# Patient Record
Sex: Female | Born: 1975
Health system: Southern US, Community
[De-identification: ages and names within clinical notes are randomized; demographics above are authoritative.]

## PROBLEM LIST (undated history)

## (undated) DIAGNOSIS — K219 Gastro-esophageal reflux disease without esophagitis: Secondary | ICD-10-CM

## (undated) HISTORY — PX: NO PAST SURGERIES: SHX2092

---

## 2009-01-03 ENCOUNTER — Inpatient Hospital Stay (HOSPITAL_COMMUNITY): Admission: AD | Admit: 2009-01-03 | Discharge: 2009-01-03 | Payer: Self-pay | Admitting: Family Medicine

## 2009-01-03 ENCOUNTER — Emergency Department (HOSPITAL_COMMUNITY): Admission: EM | Admit: 2009-01-03 | Discharge: 2009-01-03 | Payer: Self-pay | Admitting: Family Medicine

## 2009-01-04 ENCOUNTER — Ambulatory Visit: Payer: Self-pay | Admitting: Family Medicine

## 2009-01-04 ENCOUNTER — Inpatient Hospital Stay (HOSPITAL_COMMUNITY): Admission: AD | Admit: 2009-01-04 | Discharge: 2009-01-04 | Payer: Self-pay | Admitting: Family Medicine

## 2009-02-02 ENCOUNTER — Encounter: Admission: RE | Admit: 2009-02-02 | Discharge: 2009-02-02 | Payer: Self-pay | Admitting: Pulmonary Disease

## 2009-04-18 ENCOUNTER — Ambulatory Visit (HOSPITAL_COMMUNITY): Admission: RE | Admit: 2009-04-18 | Discharge: 2009-04-18 | Payer: Self-pay | Admitting: Obstetrics & Gynecology

## 2009-04-29 ENCOUNTER — Ambulatory Visit: Payer: Self-pay | Admitting: Obstetrics & Gynecology

## 2009-04-29 ENCOUNTER — Inpatient Hospital Stay (HOSPITAL_COMMUNITY): Admission: AD | Admit: 2009-04-29 | Discharge: 2009-05-01 | Payer: Self-pay | Admitting: Obstetrics & Gynecology

## 2009-08-28 IMAGING — US US OB FOLLOW-UP
1 series · 14 of 22 positions shown · non-contrast
Comparison: none

OBSTETRICAL ULTRASOUND:
 This ultrasound exam was performed in the [HOSPITAL] Ultrasound Department.  The OB US report was generated in the AS system, and faxed to the ordering physician.  This report is also available in [REDACTED] PACS.

[Series 1: us ob follow up · 0.33mm/px · 14 of 22 slices shown]
[im 1/22]
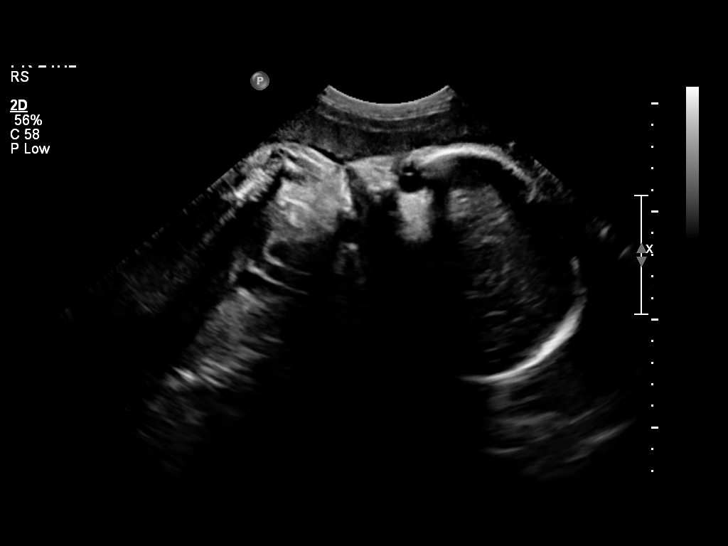
[im 3/22]
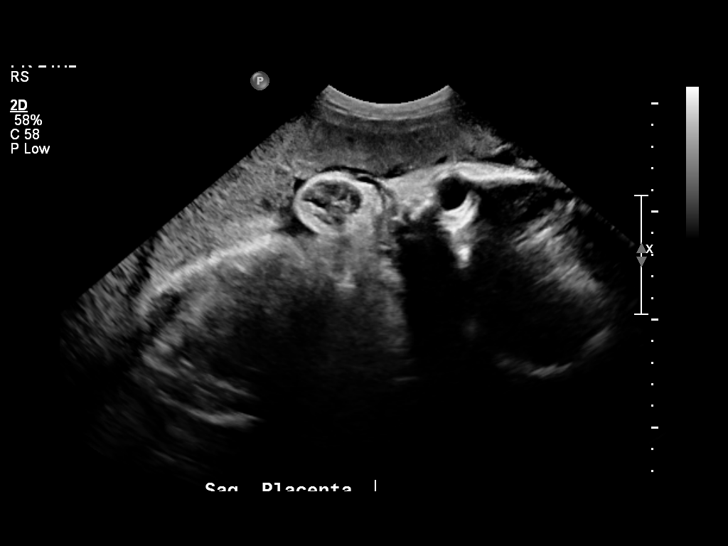
[im 4/22]
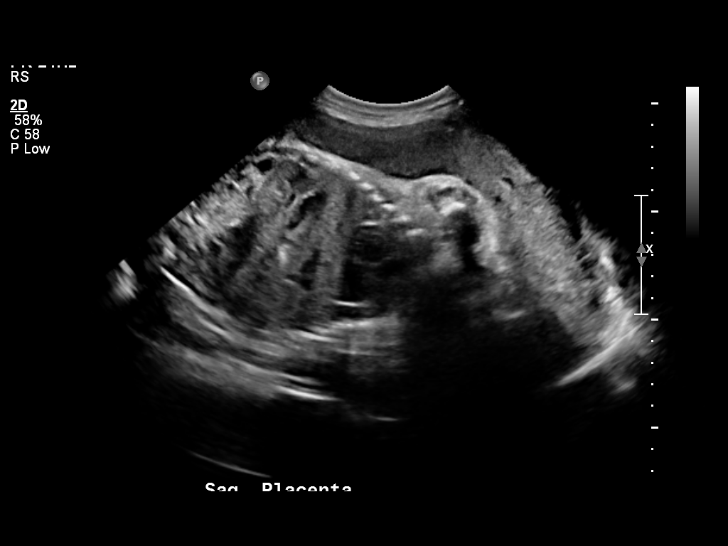
[im 6/22]
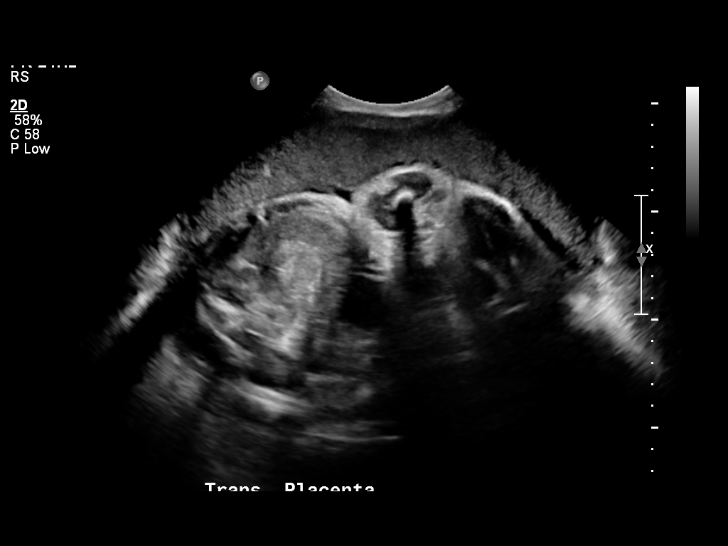
[im 8/22]
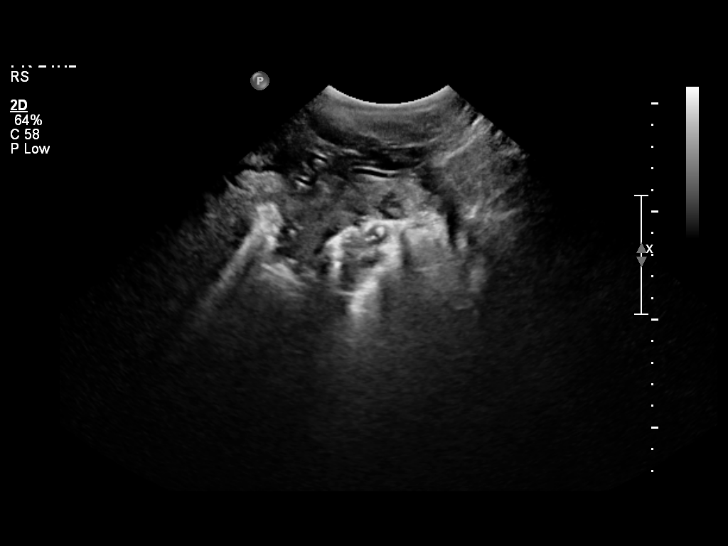
[im 9/22]
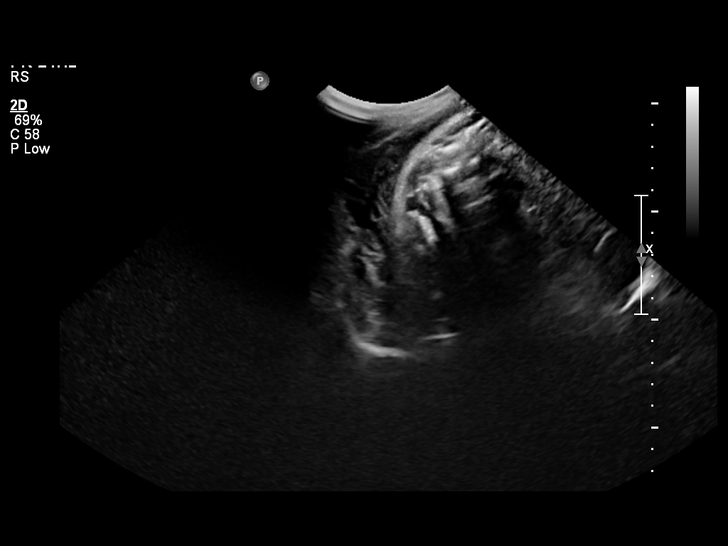
[im 11/22]
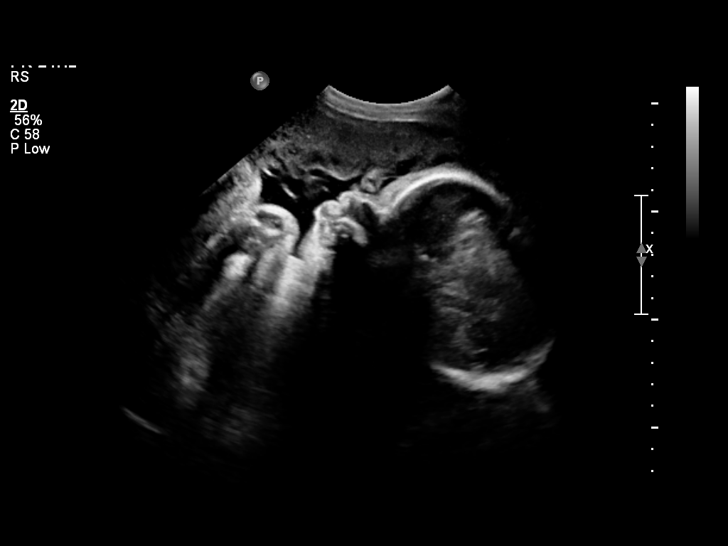
[im 12/22]
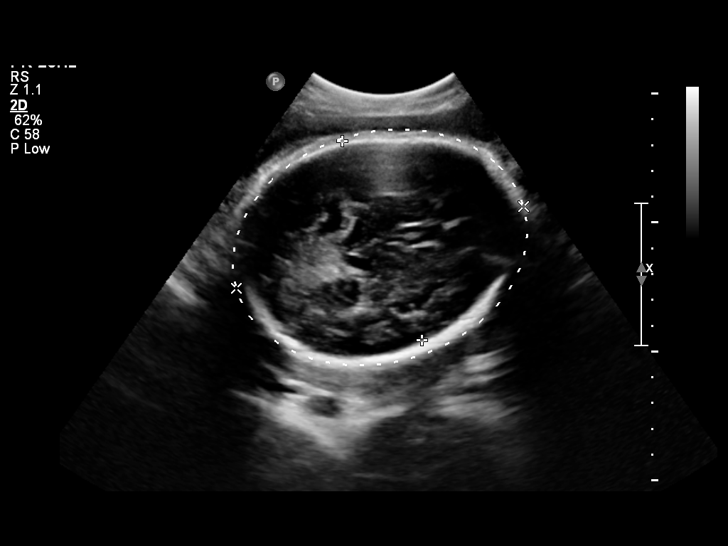
[im 14/22]
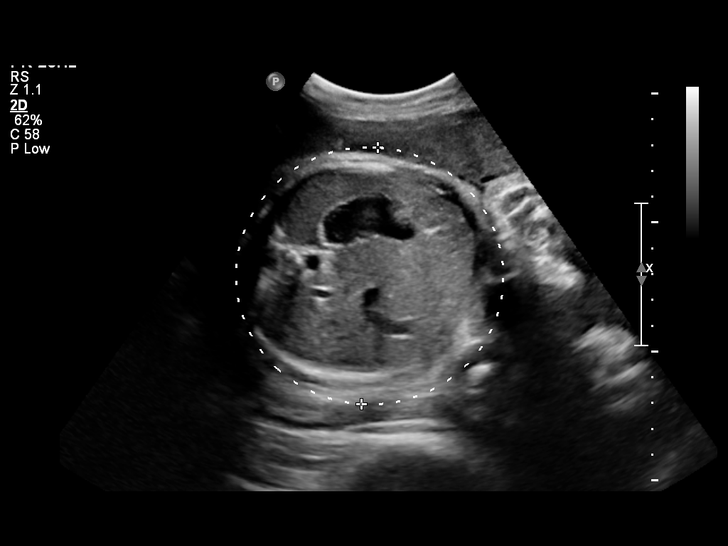
[im 15/22]
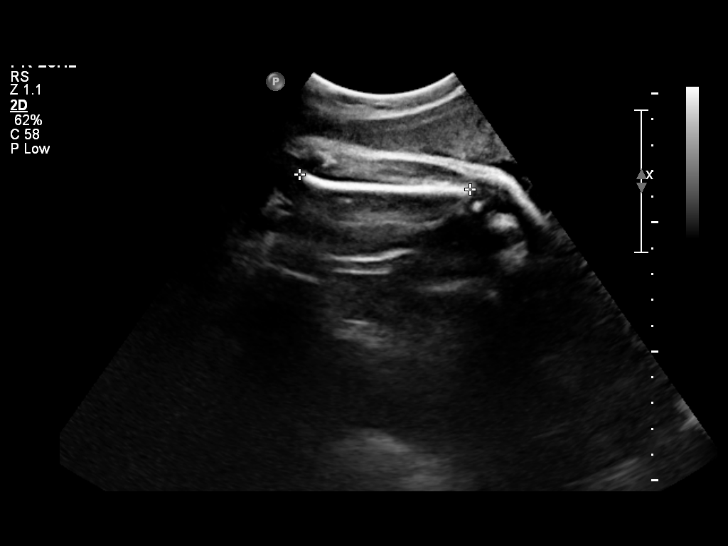
[im 17/22]
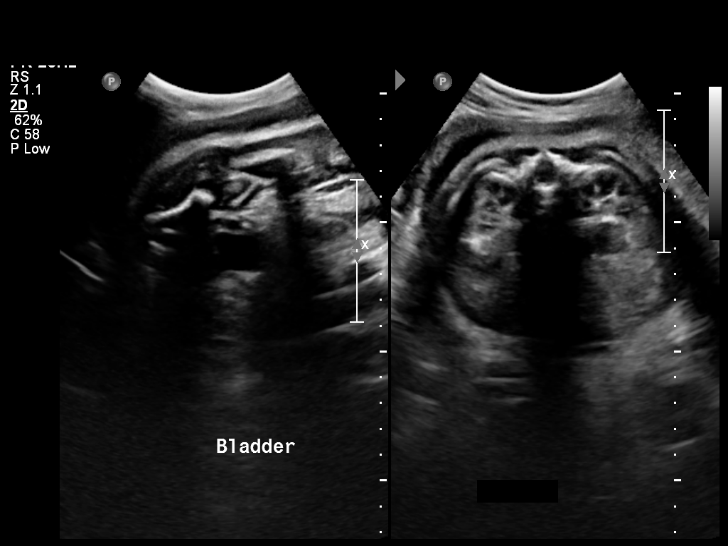
[im 19/22]
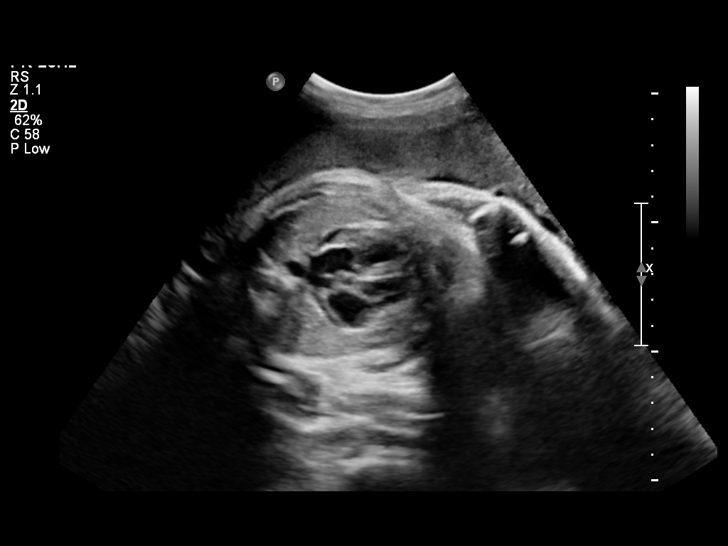
[im 20/22]
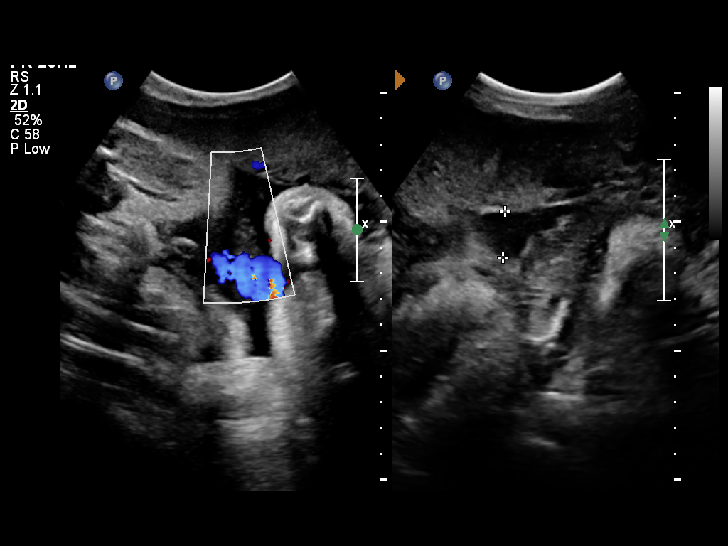
[im 22/22]
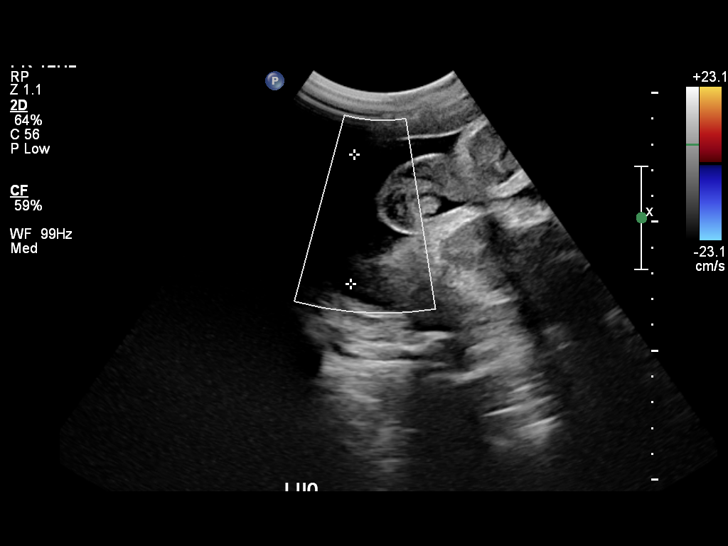

[14 of 22 positions shown; findings below may reference images not displayed]

IMPRESSION: See AS Obstetric US report.

## 2010-12-23 LAB — CBC
HCT: 36.3 % (ref 36.0–46.0)
MCV: 84.2 fL (ref 78.0–100.0)
Platelets: 176 10*3/uL (ref 150–400)
RBC: 4.07 MIL/uL (ref 3.87–5.11)
WBC: 11.6 10*3/uL — ABNORMAL HIGH (ref 4.0–10.5)
WBC: 12.8 10*3/uL — ABNORMAL HIGH (ref 4.0–10.5)

## 2010-12-23 LAB — RAPID HIV SCREEN (WH-MAU): Rapid HIV Screen: NONREACTIVE

## 2010-12-23 LAB — RPR: RPR Ser Ql: NONREACTIVE

## 2010-12-27 LAB — GC/CHLAMYDIA PROBE AMP, GENITAL
Chlamydia, DNA Probe: POSITIVE — AB
GC Probe Amp, Genital: NEGATIVE

## 2010-12-27 LAB — URINE CULTURE
Colony Count: >=100000
Colony Count: >=100000

## 2010-12-27 LAB — POCT URINALYSIS DIP (DEVICE)
Bilirubin Urine: NEGATIVE
Glucose, UA: NEGATIVE mg/dL
Ketones, ur: NEGATIVE mg/dL
Specific Gravity, Urine: 1.02 (ref 1.005–1.030)

## 2010-12-27 LAB — DIFFERENTIAL
Basophils Absolute: 0 10*3/uL (ref 0.0–0.1)
Basophils Relative: 0 % (ref 0–1)
Eosinophils Absolute: 0.1 10*3/uL (ref 0.0–0.7)
Neutro Abs: 11.1 10*3/uL — ABNORMAL HIGH (ref 1.7–7.7)
Neutrophils Relative %: 91 % — ABNORMAL HIGH (ref 43–77)

## 2010-12-27 LAB — WET PREP, GENITAL
Trich, Wet Prep: NONE SEEN
Yeast Wet Prep HPF POC: NONE SEEN

## 2010-12-27 LAB — POCT PREGNANCY, URINE: Preg Test, Ur: POSITIVE

## 2010-12-27 LAB — URINALYSIS, ROUTINE W REFLEX MICROSCOPIC
Bilirubin Urine: NEGATIVE
Nitrite: NEGATIVE
Protein, ur: 100 mg/dL — AB
Specific Gravity, Urine: 1.01 (ref 1.005–1.030)
Urobilinogen, UA: 0.2 mg/dL (ref 0.0–1.0)

## 2010-12-27 LAB — CBC
MCHC: 33.7 g/dL (ref 30.0–36.0)
MCV: 88.5 fL (ref 78.0–100.0)
RDW: 13.4 % (ref 11.5–15.5)

## 2010-12-27 LAB — URINE MICROSCOPIC-ADD ON

## 2010-12-27 LAB — RPR: RPR Ser Ql: NONREACTIVE

## 2010-12-27 LAB — RUBELLA SCREEN: Rubella: 31.9 IU/mL — ABNORMAL HIGH

## 2016-04-27 ENCOUNTER — Ambulatory Visit (INDEPENDENT_AMBULATORY_CARE_PROVIDER_SITE_OTHER): Payer: Self-pay | Admitting: Osteopathic Medicine

## 2016-04-27 VITALS — BP 104/68 | HR 74 | Temp 98.1°F | Resp 16 | Ht 59.0 in | Wt 100.6 lb

## 2016-04-27 DIAGNOSIS — R634 Abnormal weight loss: Secondary | ICD-10-CM

## 2016-04-27 DIAGNOSIS — R748 Abnormal levels of other serum enzymes: Secondary | ICD-10-CM

## 2016-04-27 DIAGNOSIS — M778 Other enthesopathies, not elsewhere classified: Secondary | ICD-10-CM | POA: Insufficient documentation

## 2016-04-27 DIAGNOSIS — R1013 Epigastric pain: Secondary | ICD-10-CM | POA: Insufficient documentation

## 2016-04-27 DIAGNOSIS — M659 Synovitis and tenosynovitis, unspecified: Secondary | ICD-10-CM

## 2016-04-27 LAB — COMPLETE METABOLIC PANEL WITH GFR
ALBUMIN: 4.1 g/dL (ref 3.6–5.1)
ALK PHOS: 43 U/L (ref 33–115)
ALT: 38 U/L — AB (ref 6–29)
AST: 45 U/L — AB (ref 10–30)
BUN: 16 mg/dL (ref 7–25)
CALCIUM: 8.8 mg/dL (ref 8.6–10.2)
CHLORIDE: 106 mmol/L (ref 98–110)
CO2: 26 mmol/L (ref 20–31)
Creat: 0.82 mg/dL (ref 0.50–1.10)
GFR, EST NON AFRICAN AMERICAN: 89 mL/min (ref >=60)
Glucose, Bld: 93 mg/dL (ref 65–99)
POTASSIUM: 4.5 mmol/L (ref 3.5–5.3)
SODIUM: 139 mmol/L (ref 135–146)
Total Bilirubin: 0.3 mg/dL (ref 0.2–1.2)
Total Protein: 7 g/dL (ref 6.1–8.1)

## 2016-04-27 LAB — POCT CBC
Granulocyte percent: 69.2 %G (ref 37–80)
HCT, POC: 40.8 % (ref 37.7–47.9)
HEMOGLOBIN: 13.6 g/dL (ref 12.2–16.2)
LYMPH, POC: 2.2 (ref 0.6–3.4)
MCH, POC: 28.8 pg (ref 27–31.2)
MCHC: 33.4 g/dL (ref 31.8–35.4)
MCV: 86.3 fL (ref 80–97)
MID (cbc): 0.3 (ref 0–0.9)
MPV: 7.4 fL (ref 0–99.8)
POC Granulocyte: 5.6 (ref 2–6.9)
POC LYMPH PERCENT: 27.6 %L (ref 10–50)
POC MID %: 3.2 %M (ref 0–12)
Platelet Count, POC: 176 10*3/uL (ref 142–424)
RBC: 4.73 M/uL (ref 4.04–5.48)
RDW, POC: 13.5 %
WBC: 8.1 10*3/uL (ref 4.6–10.2)

## 2016-04-27 LAB — TSH: TSH: 2.25 m[IU]/L

## 2016-04-27 MED ORDER — IBUPROFEN 400 MG PO TABS: 400.0000 mg | ORAL_TABLET | Freq: Four times a day (QID) | ORAL | 0 refills | Status: DC | PRN

## 2016-04-27 MED ORDER — OMEPRAZOLE 20 MG PO TBEC
20.0000 mg | DELAYED_RELEASE_TABLET | Freq: Two times a day (BID) | ORAL | 1 refills | Status: DC
Start: 2016-04-27 — End: 2017-08-13

## 2016-04-27 NOTE — Assessment & Plan Note (Addendum)
As best I can tell with language barrier, most likely GERD, uncertain how accurate weight loss concern is, last records available are from 2010/7 years ago

## 2016-04-27 NOTE — Assessment & Plan Note (Signed)
Difficult to tell on exam what exactly is hurting the patient more, she states every test elicits pain. Rest, ice, NSAID, recheck

## 2016-04-27 NOTE — Patient Instructions (Signed)
?au b?ng, Ng??i l?n (Abdominal Pain, Adult) C nhi?u nguyn nhn d?n ??n ?au b?ng. Thng th??ng ?au b?ng l do m?t b?nh gy ra v s? khng ?? n?u khng ?i?u tr?Marland Kitchen. B?nh ny c th? ???c theo di v ?i?u tr? t?i nh. Chuyn gia ch?m [place] s?c kh?e s? ti?n hnh khm th?c th? v c th? yu c?u lm xt nghi?m mu v ch?p X quang ?? xc ??nh m?c ?? nghim tr?ng c?a c?n ?au b?ng. Tuy nhin, trong nhi?u tr??ng h?p, ph?i m?t nhi?u th?i gian h?n ?? xc ??nh r nguyn nhn gy ?au b?ng. Tr??c khi tm ra nguyn nhn, chuyn gia ch?m [place] s?c kh?e c th? khng bi?t li?u qu v? c c?n lm thm xt nghi?m ho?c ti?p t?c ?i?u tr? hay khng. H??NG D?N CH?M [place] T?I NH  Theo di c?n ?au b?ng xem c b?t k? thay ??i no khng. Nh?ng hnh ??ng sau c th? gip lo?i b? b?t c? c?m gic kh ch?u no qu v? ?ang b?.  Ch? s? d?ng thu?c khng c?n k ??n ho?c thu?c c?n k ??n theo ch? d?n c?a chuyn gia ch?m [place] s?c kh?e.  Khng dng thu?c nhu?n trng tr? khi ???c chuyn gia ch?m [place] s?c kh?e ch? ??nh.  Th? dng m?t ch? ?? ?n l?ng (n??c lu?c th?t, tr, ho?c n??c) theo ch? d?n c?a chuyn gia ch?m [place] s?c kh?e. Chuy?n d?n sang m?t ch? ?? ?n nh? n?u ch?u ???c. ?I KHM N?U:  Qu v? b? ?au b?ng khng r nguyn nhn.  Qu v? b? ?au b?ng km theo bu?n nn ho?c tiu ch?y.  Qu v? b? ?au khi ?i ti?u ho?c ?i ngoi.  Qu v? b? c?n ?au b?ng lm th?c gi?c vo ban ?m.  Qu v? b? ?au b?ng n?ng thm ho?c ?? h?n khi ?n.  Qu v? b? ?au b?ng n?ng thm khi ?n ?? ?n nhi?u ch?t bo.  Qu v? b? s?t. NGAY L?P T?C ?I KHM N?U:   C?n ?au khng kh?i trong vng 2 gi?Ladell Heads.  Qu v? v?n ti?p t?c b? i (nn m?a).  Ch? c th? c?m th?y ?au ? m?t s? ph?n b?ng, ch?ng h?n nh? ? ph?n b?ng bn ph?i ho?c bn d??i tri.  Phn c?a qu v? c mu ho?c c mu ?en nh? h?c n. ??M B?O QU V?:  Hi?u r cc h??ng d?n ny.  S? theo di tnh tr?ng c?a mnh.  S? yu c?u tr? gip ngay l?p t?c n?u qu v? c?m th?y khng kh?e ho?c th?y tr?m tr?ng h?n.   Thng  tin ny khng nh?m m?c ?ch thay th? cho l?i khuyn m chuyn gia ch?m [place] s?c kh?e ni v?i qu v?. Hy b?o ??m qu v? ph?i th?o lu?n b?t k? v?n ?? g m qu v? c v?i chuyn gia ch?m  s?c kh?e c?a qu v?.   Document Released: 09/03/2005 Document Revised: 09/24/2014 Elsevier Interactive Patient Education 2016 Elsevier Inc.    Vim gn (Tendinitis) Vim gn l b? s?ng v vim ? gn. Gn l cc m gi?ng nh? d?i b?ng n?i c? vo x??ng. Vim gn th??ng x?y ra ?:   Vai (chp xoay).  Gt chn (Gn [place]).  Khu?u tay (gn ba ??u). NGUYN NHN Vim gn th??ng gy ra b?i vi?c l?m d?ng gn, c? v kh?p lin quan. Khi m xung quanh gn (ho?t d?ch) b? vim, tnh tr?ng ny ???c g?i l vim bao gn. Vim gn th??ng x?y ra ? nh?ng ng??i c cng  vi?c ?i h?i ph?i c? ??ng l?p ?i l?p l?i. TRI?U CH?NG  ?au.  Nh?y c?m ?au.  S?ng nh?. CH?N ?ON Vim gn th??ng ???c ch?n ?on b?ng cch khm th?c th?Shaune Pascal gia ch?m [place] s?c kh?e c?a qu v? c?ng c th? yu c?u ch?p X-quang ho?c cc xt nghi?m hnh ?nh khc. ?I?U TR? Chuyn gia ch?m [place] s?c kh?e c?a qu v? c th? gi?i thi?u m?t s? lo?i thu?c ho?c cc bi t?p cho vi?c ?i?u tr? c?a qu v?. H??NG D?N CH?M [place] T?I NH   S? d?ng m?t b?ng ?eo ho?c n?p nh? ch? d?n c?a chuyn gia ch?m [place] s?c kh?e cho ??n khi c?n ?au gi?m.  Ch??m ? l?nh ln vng b? th??ng.  Cho ? l?nh vo ti nh?a.  ?? kh?n t?m vo gi?a da v ti.  ?? ? l?nh trong kho?ng 15-20 pht, 3-4 l?n m?t ngy, ho?c theo ch? d?n c?a chuyn gia ch?m [place] s?c kh?e c?a qu v?.  Trnh s? d?ng chn tay trong khi ?au gn. Ch? th?c hi?n trong ph?m vi cc bi t?p c? ??ng nh? nhng theo ch? d?n c?a chuyn gia ch?m [place] s?c kh?e. Ng?ng t?p n?u ?au t?ng ho?c c?m gic kh ch?u t?ng ln, tr? khi ???c chuyn gia ch?m [place] s?c kh?e c h??ng d?n khc.  Ch? s? d?ng thu?c khng c?n k ??n ho?c thu?c c?n k ??n ?? gi?m ?au, gi?m c?m gic kh ch?u ho?c h? s?t theo ch? d?n c?a chuyn gia ch?m [place] s?c kh?e c?a qu  v?. ?I KHM N?U:   C?n ?au v s?ng n? t?ng ln.  Qu v? c cc tri?u ch?ng m?i khng r nguyn nhn, ??c bi?t l t ? bn tay t?ng ln. ??M B?O QU V?:   Hi?u r cc h??ng d?n ny.  S? theo di tnh tr?ng c?a mnh.  S? yu c?u tr? gip ngay l?p t?c n?u qu v? c?m th?y khng kh?e ho?c th?y tr?m tr?ng h?n.   Thng tin ny khng nh?m m?c ?ch thay th? cho l?i khuyn m chuyn gia ch?m [place] s?c kh?e ni v?i qu v?. Hy b?o ??m qu v? ph?i th?o lu?n b?t k? v?n ?? g m qu v? c v?i chuyn gia ch?m [place] s?c kh?e c?a qu v?.   Document Released: 09/03/2005 Document Revised: 09/24/2014 Elsevier Interactive Patient Education [employer]

## 2016-04-27 NOTE — Assessment & Plan Note (Signed)
RTC for weight check, no alarm signs on physical exam, language barrier not certain if patient actually lost 30 pounds in the last month

## 2016-04-27 NOTE — Progress Notes (Signed)
HPI: Maria Steele is a 40 y.o. female  who [facility] today, 04/27/16,  for chief complaint of:  Chief Complaint  Patient presents with  . Arm Pain    right arm, x 2 weeks, no known injury to arm    Significant language barrier, patient is ethnic [place], however the lungs 2 community that speaks dialect for which no translator currently available. She is here with family who speaks proficient [place], [place] interpreters were called, no one available to interpret at this point. Did best I could with help from family members to translate  Right arm pain, patient points to lateral elbow, ongoing for about 2 weeks. 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 Specific injury she can recall.  Abdominal pain: Points to epigastric region, difficult to elicit whether better or worse with eating, family is concerned about some weight loss, the report 30 pound weight loss in the past month. No bloody or black stool. Occasional nausea, no vomiting. Patient reports low appetite.  Patient is accompanied by friend and husband who assists with history-taking.   [employer] Interpreter - no [place] interpreter available    Past medical, surgical, social and family history reviewed: No past medical history on file. No past surgical history on file. Social History  Substance Use Topics  . Smoking status: Never Smoker  . Smokeless tobacco: Never Used  . Alcohol use Not on file   No family history on file.   Current medication list and allergy/intolerance information reviewed:   No current outpatient prescriptions on file.   No current facility-administered medications for this visit.    No Known Allergies    Review of Systems:Limited due to language barrier  Constitutional:  (+)unintentional weight changes.  Respiratory:  No  shortness of breath  Gastrointestinal: (+)  abdominal pain as per HPI, No  vomiting,  No  blood in stool  Musculoskeletal: (+)new myalgia/arthralgia  Skin: No  Rash,   Neurologic: No  weakness, No  dizziness   Exam:  BP 104/68 (BP Location: Right Arm, Patient Position: Sitting, Cuff Size: Normal)   Pulse 74   Temp 98.1 F (36.7 C) (Oral)   Resp 16   Ht 4\' 11"  (1.499 m)   Wt 100 lb 9.6 oz (45.6 kg)   LMP 03/27/2016   SpO2 100%   BMI 20.32 kg/m   Constitutional: VS see above. General Appearance: alert, well-developed, well-nourished, NAD  Eyes: Normal lids and conjunctive, non-icteric sclera  Ears, Nose, Mouth, Throat: MMM, Normal external inspection ears/nares/mouth/lips/gums.  Neck: No masses, trachea midline. No thyroid enlargement. No tenderness/mass appreciated. No lymphadenopathy  Respiratory: Normal respiratory effort. no wheeze, no rhonchi, no rales  Cardiovascular: S1/S2 normal, no murmur, no rub/gallop auscultated. RRR. No lower extremity edema.   Gastrointestinal: Nontender on exam, no masses. No hepatomegaly, no splenomegaly. No hernia appreciated. Bowel sounds normal. Rectal exam deferred.   Musculoskeletal: Gait normal. No clubbing/cyanosis of digits. Patient reports pain with every motion of the elbow, flexion/extension of wrist against resistance, pronation/supination of her students resistance, difficult to elicit a positive test mentation by language barrier  Neurological:  Motor and sensation intact and symmetric. Cerebellar reflexes grossly intact. Normal balance/coordination. No tremor.   Skin: warm, dry, intact. No rash/ulcer.  Psychiatric: Normal mood and affect..    Results for orders placed or performed in visit on 04/27/16 (from the past 72 hour(s))  COMPLETE METABOLIC  PANEL WITH GFR     Status: Abnormal   Collection Time: 04/27/16  4:37 PM  Result Value Ref Range   Sodium 139 135 - 146 mmol/L   Potassium 4.5 3.5 - 5.3 mmol/L   Chloride 106 98 - 110 mmol/L   CO2 26 20 - 31 mmol/L    Glucose, Bld 93 65 - 99 mg/dL   BUN 16 7 - 25 mg/dL   Creat 1.61 0.96 - 0.45 mg/dL   Total Bilirubin 0.3 0.2 - 1.2 mg/dL   Alkaline Phosphatase 43 33 - 115 U/L   AST 45 (H) 10 - 30 U/L   ALT 38 (H) 6 - 29 U/L   Total Protein 7.0 6.1 - 8.1 g/dL   Albumin 4.1 3.6 - 5.1 g/dL   Calcium 8.8 8.6 - 40.9 mg/dL   GFR, Est African American >89 >=60 mL/min   GFR, Est Non African American 89 >=60 mL/min  HIV antibody     Status: None (Preliminary result)   Collection Time: 04/27/16  4:37 PM  Result Value Ref Range   HIV 1&2 Ab, 4th Generation  NONREACTIVE    Comment:   PLEASE NOTE: This information has been disclosed to you from records whose confidentiality may be protected by state law. If your state requires such protection, then the state law prohibits you from making any further disclosure of the information without the specific written consent of the person to whom it pertains, or as otherwise permitted by law. A general authorization for the release of medical or other information is NOT sufficient for this purpose.   The performance of this assay has not been clinically validated in patients less than 27 years old.   For additional information please refer to http://education.questdiagnostics.com/faq/FAQ106.  (This link is being provided for informational/educational purposes only.)     TSH     Status: None   Collection Time: 04/27/16  4:37 PM  Result Value Ref Range   TSH 2.25 mIU/L    Comment:   Reference Range   > or = 20 Years  0.40-4.50   Pregnancy Range First trimester  0.26-2.66 Second trimester 0.55-2.73 Third trimester  0.43-2.91     POCT CBC     Status: None   Collection Time: 04/27/16  4:49 PM  Result Value Ref Range   WBC 8.1 4.6 - 10.2 K/uL   Lymph, poc 2.2 0.6 - 3.4   POC LYMPH PERCENT 27.6 10 - 50 %L   MID (cbc) 0.3 0 - 0.9   POC MID % 3.2 0 - 12 %M   POC Granulocyte 5.6 2 - 6.9   Granulocyte percent 69.2 37 - 80 %G   RBC 4.73 4.04 - 5.48 M/uL    Hemoglobin 13.6 12.2 - 16.2 g/dL   HCT, POC 81.1 91.4 - 47.9 %   MCV 86.3 80 - 97 fL   MCH, POC 28.8 27 - 31.2 pg   MCHC 33.4 31.8 - 35.4 g/dL   RDW, POC 78.2 %   Platelet Count, POC 176 142 - 424 K/uL   MPV 7.4 0 - 99.8 fL    No results found.   ASSESSMENT/PLAN:   Mild elevated liver enzymes, uncertain whether related to epigastric pain. As best I can tell, history is consistent with GERD, 30 pounds seems certainly an alarming amount of weight loss over the course of one month, patient's skin is normal turgor, no significant findings on abdominal exam, not anemic, patient's close fit her well and they  state she hasn't been shopping for any new close lately, I'm not certain whether 30 pounds is accurate. We'll have the patient follow-up for weight check  Nonspecific tendinitis of lateral elbow, most likely due to overuse. Patient without insurance unlikely to be able to afford Celebrex, ibuprofen should be reasonable cost-effective alternative which has less risk of GI symptoms as opposed to some other NSAIDs. Also initiating omeprazole.  Epigastric pain - Plan: Omeprazole 20 MG TBEC  Tendonitis of elbow, right - Plan: ibuprofen (ADVIL,MOTRIN) 400 MG tablet  Loss of weight - Plan: POCT CBC, COMPLETE METABOLIC PANEL WITH GFR, HIV antibody, TSH  Elevated liver enzymes   See individual assessment and plan notes  Visit summary with medication list and pertinent instructions was printed for patient to review - I printed the instructions in [place] as some members of patient's community are proficient in [place] language, I have no access to an interpreter at this point who speaks the patient's dialect so we'll plan to have her back, friend who speaks English was advised to call ahead to make sure that we have an interpreter available for her. All questions at time of visit were answered - patient and family instructed to contact office with any additional concerns. ER/RTC precautions  were reviewed with the patient. Follow-up plan: Return in about 4 weeks (around 05/25/2016), or sooner if needed or if worse, for WEIGHT CHECK AND RECHECK PROBLEMS.  Note: Total time spent 30 minutes, greater than 50% of the visit was spent face-to-face counseling and coordinating care for the following: The primary encounter diagnosis was Epigastric pain. Diagnoses of Tendonitis of elbow, right and Loss of weight were also pertinent to this visit.Marland Kitchen

## 2016-04-28 LAB — HIV ANTIBODY (ROUTINE TESTING W REFLEX): HIV: NONREACTIVE

## 2017-08-13 ENCOUNTER — Other Ambulatory Visit: Payer: Self-pay

## 2017-08-13 ENCOUNTER — Ambulatory Visit (HOSPITAL_COMMUNITY)
Admission: EM | Admit: 2017-08-13 | Discharge: 2017-08-13 | Disposition: A | Discharge status: Home / Self Care | Payer: 59 | Attending: Family Medicine | Admitting: Family Medicine

## 2017-08-13 ENCOUNTER — Encounter (HOSPITAL_COMMUNITY): Payer: Self-pay | Admitting: Emergency Medicine

## 2017-08-13 DIAGNOSIS — K2990 Gastroduodenitis, unspecified, without bleeding: Secondary | ICD-10-CM

## 2017-08-13 DIAGNOSIS — K2970 Gastritis, unspecified, without bleeding: Secondary | ICD-10-CM | POA: Diagnosis not present

## 2017-08-13 HISTORY — DX: Gastro-esophageal reflux disease without esophagitis: K21.9

## 2017-08-13 MED ORDER — ESOMEPRAZOLE MAGNESIUM 40 MG PO CPDR: 40.0000 mg | DELAYED_RELEASE_CAPSULE | Freq: Every day | ORAL | 0 refills | Status: DC

## 2017-08-13 MED ORDER — GI COCKTAIL ~~LOC~~
30.0000 mL | Freq: Once | ORAL | Status: AC
  Administered 2017-08-13: 30 mL via ORAL

## 2017-08-13 MED ORDER — GI COCKTAIL ~~LOC~~
ORAL | Status: AC
  Filled 2017-08-13: qty 30

## 2017-08-13 NOTE — ED Provider Notes (Signed)
[facility]   [id number] 08/13/17 Arrival Time: 1038   SUBJECTIVE:  Rober MinionBlach Rabe is a 41 y.o. female who presents to the urgent care with complaint of sudden onset of generalized abdominal pain that radiates to her back and a headache that began on Sunday.  She reports a burning pain and generalized bloating.  She reports a normal BM this morning. Pt has a history of acid reflux that was diagnosed in [place].  She was taking medication for it, Omeprazole was on her medication list, but she has not had any in a while.  Pt speaks [place] and is with a refugee Midwifehealth coordinator that has been translating for her.  Pt prefers to use her for translating, if possible.     Past Medical History:  Diagnosis Date  . Acid reflux    History reviewed. No pertinent family history. Social History   Socioeconomic History  . Marital status: Married    Spouse name: Not on file  . Number of children: Not on file  . Years of education: Not on file  . Highest education level: Not on file  Social Needs  . Financial resource strain: Not on file  . Food insecurity - worry: Not on file  . Food insecurity - inability: Not on file  . Transportation needs - medical: Not on file  . Transportation needs - non-medical: Not on file  Occupational History  . Not on file  Tobacco Use  . Smoking status: Never Smoker  . Smokeless tobacco: Never Used  Substance and Sexual Activity  . Alcohol use: No    Frequency: Never  . Drug use: No  . Sexual activity: Not on file  Other Topics Concern  . Not on file  Social History Narrative  . Not on file   No outpatient medications have been marked as taking for the 08/13/17 encounter [facility](Hospital Encounter).   No Known Allergies    ROS: As per HPI, remainder of ROS negative.   OBJECTIVE:   Vitals:   08/13/17 1158  Pulse: 75  Temp: 98.3 F (36.8 C)  TempSrc: Oral  SpO2: 100%     General appearance: alert; no distress Eyes: PERRL; EOMI;  conjunctiva normal HENT: normocephalic; atraumatic;external ears normal without trauma; nasal mucosa normal; oral mucosa normal Neck: supple Lungs: clear to auscultation bilaterally Heart: regular rate and rhythm Abdomen: soft, tender epigastrium; bowel sounds normal; no masses or organomegaly; no guarding or rebound tenderness Back: no CVA tenderness Extremities: no cyanosis or edema; symmetrical with no gross deformities Skin: warm and dry  Neurologic: normal gait; grossly normal Psychological: alert and cooperative; normal mood and affect      Labs:  Results for orders placed or performed in visit on 04/27/16  COMPLETE METABOLIC PANEL WITH GFR  Result Value Ref Range   Sodium 139 135 - 146 mmol/L   Potassium 4.5 3.5 - 5.3 mmol/L   Chloride 106 98 - 110 mmol/L   CO2 26 20 - 31 mmol/L   Glucose, Bld 93 65 - 99 mg/dL   BUN 16 7 - 25 mg/dL   Creat 4.090.82 8.110.50 - 9.141.10 mg/dL   Total Bilirubin 0.3 0.2 - 1.2 mg/dL   Alkaline Phosphatase 43 33 - 115 U/L   AST 45 (H) 10 - 30 U/L   ALT 38 (H) 6 - 29 U/L   Total Protein 7.0 6.1 - 8.1 g/dL   Albumin 4.1 3.6 - 5.1 g/dL   Calcium 8.8 8.6 - 78.210.2 mg/dL  GFR, Est African American >89 >=60 mL/min   GFR, Est Non African American 89 >=60 mL/min  HIV antibody  Result Value Ref Range   HIV 1&2 Ab, 4th Generation NONREACTIVE NONREACTIVE  TSH  Result Value Ref Range   TSH 2.25 mIU/L  POCT CBC  Result Value Ref Range   WBC 8.1 4.6 - 10.2 K/uL   Lymph, poc 2.2 0.6 - 3.4   POC LYMPH PERCENT 27.6 10 - 50 %L   MID (cbc) 0.3 0 - 0.9   POC MID % 3.2 0 - 12 %M   POC Granulocyte 5.6 2 - 6.9   Granulocyte percent 69.2 37 - 80 %G   RBC 4.73 4.04 - 5.48 M/uL   Hemoglobin 13.6 12.2 - 16.2 g/dL   HCT, POC 16.140.8 09.637.7 - 47.9 %   MCV 86.3 80 - 97 fL   MCH, POC 28.8 27 - 31.2 pg   MCHC 33.4 31.8 - 35.4 g/dL   RDW, POC 04.513.5 %   Platelet Count, POC 176 142 - 424 K/uL   MPV 7.4 0 - 99.8 fL    Labs Reviewed - No data to display  No results  found.     ASSESSMENT & PLAN:  1. Gastritis and gastroduodenitis     Meds ordered this encounter  Medications  . gi cocktail (Maalox,Lidocaine,Donnatal)  . esomeprazole (NEXIUM) 40 MG capsule    Sig: Take 1 capsule (40 mg total) by mouth daily.    Dispense:  30 capsule    Refill:  0    Reviewed expectations re: course of current medical issues. Questions answered. Outlined signs and symptoms indicating need for more acute intervention. Patient verbalized understanding. After Visit Summary given.    Procedures:      Elvina SidleLauenstein, Sharonna Vinje, MD 08/13/17 1238

## 2017-08-13 NOTE — Discharge Instructions (Signed)
Take the sample Nexium once per day.  If not improving, go to emergency department for further evaluation and imaging.

## 2017-08-13 NOTE — ED Triage Notes (Addendum)
Pt reports a sudden onset of generalized abdominal pain that radiates to her back and a headache that began on Sunday.  She reports a burning pain and generalized bloating.  She reports a normal BM this morning. Pt has a history of acid reflux that was diagnosed in [place].  She was taking medication for it, Omeprazole was on her medication list, but she has not had any in a while.  Pt speaks [place]ar[place] and is with a refugee Midwifehealth coordinator that has been translating for her.  Pt prefers to use her for translating, if possible.

## 2017-08-28 ENCOUNTER — Ambulatory Visit: Payer: 59 | Admitting: Family Medicine

## 2017-08-28 ENCOUNTER — Encounter: Payer: Self-pay | Admitting: Family Medicine

## 2017-08-28 VITALS — BP 108/68 | HR 70 | Ht <= 58 in | Wt 102.8 lb

## 2017-08-28 DIAGNOSIS — Z3201 Encounter for pregnancy test, result positive: Secondary | ICD-10-CM

## 2017-08-28 DIAGNOSIS — N926 Irregular menstruation, unspecified: Secondary | ICD-10-CM | POA: Diagnosis not present

## 2017-08-28 LAB — POCT URINE PREGNANCY: Preg Test, Ur: POSITIVE — AB

## 2017-08-28 NOTE — Progress Notes (Signed)
   Subjective:    Patient ID: Maria Steele, female    DOB: 08/09/1976, 41 y.o.   MRN: [id number]  HPI Chief Complaint  Patient presents with  . New Patient (Initial Visit)    missed period x 2 month, pain in stomach , nausea  started last week    She is new to our practice and here with a language interpreter.  Moved here 8 years ago. No consistent medical care.   Complains of missed period for the past 2 months. Cannot recall LMP.  She has also had some nausea.  States she thinks she might be pregnant.  She would like to know how far along her pregnancy is.  Denies fever, chills, vomiting, diarrhea, abdominal pain, vaginal discharge, urinary symptoms.  She has 3 children.  Has never been to school. She is married.   States she is not taking any medication currently.  She does not drink alcohol or smoke. Denies any issues in the past with pregnancies. Does not have an OB/GYN.  Reviewed allergies, medications, past medical, surgical,  and social history.    Review of Systems Pertinent positives and negatives in the history of present illness.     Objective:   Physical Exam BP 108/68   Pulse 70   Ht 4' (1.219 m)   Wt 102 lb 12.8 oz (46.6 kg)   LMP  (LMP Unknown)   SpO2 99%   BMI 31.37 kg/m   Alert and in no distress.  Pharyngeal area is normal. Neck is supple without adenopathy or thyromegaly. Cardiac exam shows a regular sinus rhythm without murmurs or gallops. Lungs are clear to auscultation.      Assessment & Plan:  Missed periods - Plan: POCT urine pregnancy, CBC with Differential/Platelet, Comprehensive metabolic panel, hCG, quantitative, pregnancy  Encounter for pregnancy test, result positive - Plan: CBC with Differential/Platelet, Comprehensive metabolic panel, hCG, quantitative, pregnancy  Discussed that urine pregnancy test is positive.  Discussed avoiding any harmful medications.  Recommend she start taking over-the-counter prenatal vitamin with folic  acid. Advised her on red flags such as persistent abdominal pain. We will check a quantitative hCG and refer to OB/GYN.  She is aware that she must call and schedule an appointment.  A list of OB/GYN offices was provided.  She states her daughter reads and speaks English well and can help her with this. Declines flu shot.

## 2017-08-28 NOTE — Patient Instructions (Addendum)
You are pregnant. Please call and schedule with a OB/GYN office. A list of options is below. You can schedule with any of them.  Start taking a prenatal vitamin. You can get this over the counter, make sure it has folic acid.   We will call you with your lab results.   Obgyn Offices:   [facility] Address: [address] [place], [place] [address] Phone: [phone]  [facility] [address] [place], [place] [id number] [phone]  [facility] [address] [place], [place] [address] Phone: [phone]  [facility] [address] [place], [place] [address] Phone: [phone]      Commonly Asked Questions During Pregnancy  Cats: A parasite can be excreted in cat feces.  To avoid exposure you need to have another person empty the little box.  If you must empty the litter box you will need to wear gloves.  Wash your hands after handling your cat.  This parasite can also be found in raw or undercooked meat so this should also be avoided.  Colds, Sore Throats, Flu: Please check your medication sheet to see what you can take for symptoms.  If your symptoms are unrelieved by these medications please call the office.  Dental Work: Most any dental work Agricultural consultantyour dentist recommends is permitted.  X-rays should only be taken during the first trimester if absolutely necessary.  Your abdomen should be shielded with a lead apron during all x-rays.  Please notify your provider prior to receiving any x-rays.  Novocaine is fine; gas is not recommended.  If your dentist requires a note from us prior to dental work please call the office and we will provide one for you.  Exercise: Exercise is an important part of staying healthy during your pregnancy.  You may continue most exercises you were accustomed to prior to pregnancy.  Later in your pregnancy you will most likely notice you have difficulty with  activities requiring balance like riding a bicycle.  It is important that you listen to your body and avoid activities that put you at a higher risk of falling.  Adequate rest and staying well hydrated are a must!  If you have questions about the safety of specific activities ask your provider.    Exposure to Children with illness: Try to avoid obvious exposure; report any symptoms to us when noted,  If you have chicken pos, red measles or mumps, you should be immune to these diseases.   Please do not take any vaccines while pregnant unless you have checked with your OB provider.  Fetal Movement: After 28 weeks we recommend you do "kick counts" twice daily.  Lie or sit down in a calm quiet environment and count your baby movements "kicks".  You should feel your baby at least 10 times per hour.  If you have not felt 10 kicks within the first hour get up, walk around and have something sweet to eat or drink then repeat for an additional hour.  If count remains less than 10 per hour notify your provider.  Fumigating: Follow your pest control agent's advice as to how long to stay out of your home.  Ventilate the area well before re-entering.  Hemorrhoids:   Most over-the-counter preparations can be used during pregnancy.  Check your medication to see what is safe to use.  It is important to use a stool softener or fiber in your diet and to drink  lots of liquids.  If hemorrhoids seem to be getting worse please call the office.   Hot Tubs:  Hot tubs Jacuzzis and saunas are not recommended while pregnant.  These increase your internal body temperature and should be avoided.  Intercourse:  Sexual intercourse is safe during pregnancy as long as you are comfortable, unless otherwise advised by your provider.  Spotting may occur after intercourse; report any bright red bleeding that is heavier than spotting.  Labor:  If you know that you are in labor, please go to the hospital.  If you are unsure, please call the  office and let us help you decide what to do.  Lifting, straining, etc:  If your job requires heavy lifting or straining please check with your provider for any limitations.  Generally, you should not lift items heavier than that you can lift simply with your hands and arms (no back muscles)  Painting:  Paint fumes do not harm your pregnancy, but may make you ill and should be avoided if possible.  Latex or water based paints have less odor than oils.  Use adequate ventilation while painting.  Permanents & Hair Color:  Chemicals in hair dyes are not recommended as they cause increase hair dryness which can increase hair loss during pregnancy.  " Highlighting" and permanents are allowed.  Dye may be absorbed differently and permanents may not hold as well during pregnancy.  Sunbathing:  Use a sunscreen, as skin burns easily during pregnancy.  Drink plenty of fluids; avoid over heating.  Tanning Beds:  Because their possible side effects are still unknown, tanning beds are not recommended.  Ultrasound Scans:  Routine ultrasounds are performed at approximately 20 weeks.  You will be able to see your baby's general anatomy an if you would like to know the gender this can usually be determined as well.  If it is questionable when you conceived you may also receive an ultrasound early in your pregnancy for dating purposes.  Otherwise ultrasound exams are not routinely performed unless there is a medical necessity.  Although you can request a scan we ask that you pay for it when conducted because insurance does not cover " patient request" scans.  Work: If your pregnancy proceeds without complications you may work until your due date, unless your physician or employer advises otherwise.  Round Ligament Pain/Pelvic Discomfort:  Sharp, shooting pains not associated with bleeding are fairly common, usually occurring in the second trimester of pregnancy.  They tend to be worse when standing up or when you  remain standing for long periods of time.  These are the result of pressure of certain pelvic ligaments called "round ligaments".  Rest, Tylenol and heat seem to be the most effective relief.  As the womb and fetus grow, they rise out of the pelvis and the discomfort improves.  Please notify the office if your pain seems different than that described.  It may represent a more serious condition.

## 2017-08-29 ENCOUNTER — Telehealth: Payer: Self-pay

## 2017-08-29 LAB — COMPREHENSIVE METABOLIC PANEL
AG RATIO: 1.3 (calc) (ref 1.0–2.5)
ALBUMIN MSPROF: 3.7 g/dL (ref 3.6–5.1)
ALT: 21 U/L (ref 6–29)
AST: 21 U/L (ref 10–30)
Alkaline phosphatase (APISO): 35 U/L (ref 33–115)
BILIRUBIN TOTAL: 0.4 mg/dL (ref 0.2–1.2)
BUN: 11 mg/dL (ref 7–25)
CALCIUM: 8.5 mg/dL — AB (ref 8.6–10.2)
CO2: 23 mmol/L (ref 20–32)
Chloride: 105 mmol/L (ref 98–110)
Creat: 0.71 mg/dL (ref 0.50–1.10)
Globulin: 2.9 g/dL (calc) (ref 1.9–3.7)
Glucose, Bld: 74 mg/dL (ref 65–99)
POTASSIUM: 4.3 mmol/L (ref 3.5–5.3)
SODIUM: 135 mmol/L (ref 135–146)
TOTAL PROTEIN: 6.6 g/dL (ref 6.1–8.1)

## 2017-08-29 LAB — CBC WITH DIFFERENTIAL/PLATELET
Basophils Absolute: 19 cells/uL (ref 0–200)
Basophils Relative: 0.2 %
EOS PCT: 0.9 %
Eosinophils Absolute: 85 cells/uL (ref 15–500)
HEMATOCRIT: 37.4 % (ref 35.0–45.0)
HEMOGLOBIN: 12.4 g/dL (ref 11.7–15.5)
LYMPHS ABS: 1532 {cells}/uL (ref 850–3900)
MCH: 28.8 pg (ref 27.0–33.0)
MCHC: 33.2 g/dL (ref 32.0–36.0)
MCV: 87 fL (ref 80.0–100.0)
MPV: 10.1 fL (ref 7.5–12.5)
Monocytes Relative: 5.2 %
NEUTROS ABS: 7276 {cells}/uL (ref 1500–7800)
Neutrophils Relative %: 77.4 %
Platelets: 219 10*3/uL (ref 140–400)
RBC: 4.3 10*6/uL (ref 3.80–5.10)
RDW: 12.9 % (ref 11.0–15.0)
Total Lymphocyte: 16.3 %
WBC mixed population: 489 cells/uL (ref 200–950)
WBC: 9.4 10*3/uL (ref 3.8–10.8)

## 2017-08-29 LAB — HCG, QUANTITATIVE, PREGNANCY: HCG, Total, QN: 375789 m[IU]/mL

## 2017-08-29 NOTE — Telephone Encounter (Signed)
Called patient, and spoke with her and husband. Advised that she needed to go see an OBGYN, they were unsure of who to see. I gave them a number to [facility] and the phone number to contact so she will be able to have prenatal care.  They had no questions at this time.

## 2017-08-29 NOTE — Telephone Encounter (Signed)
-----   Message from Avanell ShackletonVickie L Henson, NP-C sent at 08/29/2017  8:58 AM EST ----- Please call and find out if she was able to get an OB/GYN appointment. Her blood work is fine. She is several months pregnant and should get in to see an OB/GYN soon.

## 2017-10-02 ENCOUNTER — Telehealth: Payer: Self-pay

## 2017-10-02 NOTE — Telephone Encounter (Signed)
Nurse from a community help called about pt recent positive pregnant results. Nurse was informed that pt can com by and pick up a copy of the results. Thanks [employer]

## 2017-10-10 ENCOUNTER — Encounter: Payer: Self-pay | Admitting: Medical

## 2017-10-10 ENCOUNTER — Ambulatory Visit: Payer: 59 | Admitting: Medical

## 2017-10-10 ENCOUNTER — Telehealth: Payer: Self-pay | Admitting: Medical

## 2017-10-10 VITALS — BP 108/68 | HR 86 | Temp 97.7°F | Ht <= 58 in | Wt 108.8 lb

## 2017-10-10 DIAGNOSIS — Z3490 Encounter for supervision of normal pregnancy, unspecified, unspecified trimester: Secondary | ICD-10-CM | POA: Insufficient documentation

## 2017-10-10 DIAGNOSIS — R112 Nausea with vomiting, unspecified: Secondary | ICD-10-CM | POA: Diagnosis not present

## 2017-10-10 DIAGNOSIS — R109 Unspecified abdominal pain: Secondary | ICD-10-CM | POA: Insufficient documentation

## 2017-10-10 MED ORDER — PRENATAL COMPLETE 14-0.4 MG PO TABS: 1.0000 | ORAL_TABLET | Freq: Every day | ORAL | 1 refills | Status: AC

## 2017-10-10 NOTE — Telephone Encounter (Signed)
Please keep me informed of [facility] appointment date [facility].  If they cannot see her within the next week , then I may go ahead and have her go for a OB ultrasound.

## 2017-10-10 NOTE — Telephone Encounter (Signed)
Please let know when you finish the note

## 2017-10-10 NOTE — Progress Notes (Signed)
Subjective: Chief Complaint  Patient presents with  . New Patient (Initial Visit)    stomach pain , vomitting, fever diarreha    Here today with her husband.  There was no interpreter that accompanied her, and we tried using the interpreter on a stick program but that did not work today either.  The husband was able to get their sponsor on the phone he works at the refugee program and is a friend of theirs.  Their sponsor/friend Ms. Alwyn Pea helped translate over the phone as well as the husband somewhat.  The family gave consent for me to speak with Mr. Alwyn Pea.   They speak a dialect of [place] either [place] or [place].    She normally sees Carin Primrose here.   She was seen here 08/28/17 for missed periods and found to be positive for pregnancy.  She is not exactly sure when her last period was but has not had a period for at least 2 or more months.  At her last visit blood work was done.  She is not currently on a prenatal vitamin.  She has not established care with an obstetrician yet.  She is here today because of lower abdominal discomfort and pressure, some nausea some vomiting.  The nausea and vomiting was worse in recent weeks but she is still having some vomiting.  She denies any vaginal bleeding.  Denies URI symptoms, no urinary symptoms, no fever, no diarrhea, no other gastric problems.  She has had 3 children, 3 prior pregnancies.  She denies having this kind of pain with her other pregnancies   Past Medical History:  Diagnosis Date  . Acid reflux    No current outpatient medications on file prior to visit.   No current facility-administered medications on file prior to visit.    ROS as in subjective   Objective: BP 108/68   Pulse 86   Temp 97.7 F (36.5 C)   Ht 4\' 10"  (1.473 m)   Wt 108 lb 12.8 oz (49.4 kg)   LMP  (LMP Unknown)   SpO2 98%   BMI 22.74 kg/m   Wt Readings from Last 3 Encounters:  10/10/17 108 lb 12.8 oz (49.4 kg)  08/28/17 102 lb  12.8 oz (46.6 kg)  04/27/16 100 lb 9.6 oz (45.6 kg)    General appearance: alert, no distress, WD/WN, petite [place] female Oral cavity: MMM, no lesions Heart: RRR, normal S1, S2, no murmurs Lungs: CTA bilaterally, no wheezes, rhonchi, or rales Abdomen: +bs, soft, gravid uterus, mild generalized lower abdominal tenderness, otherwise non distended, no masses, no hepatomegaly, no splenomegaly Pulses: 2+ symmetric, upper and lower extremities, normal cap refill no extremity edema    Assessment: Encounter Diagnoses  Name Primary?  . Pregnancy, unspecified gestational age Yes  . Abdominal discomfort   . Nausea and vomiting, intractability of vomiting not specified, unspecified vomiting type       Plan: We discussed her symptoms and concerns.  I reviewed her recent quantitative hCG that she had done here.  Her vitals are stable, she has lower abdominal tenderness and some nausea, but no other obvious urinary, respiratory, or GI symptoms.  She has had the same symptoms for apparently the last several weeks.  I personally called [facility] and they will see her for prenatal care.  We sent a referral and asked that she be seen as soon as possible.  I will start her on a prenatal vitamin today.  I advised that if  she has any worsening or significantly different symptoms in the coming days such as vaginal bleeding, fever, uncontrollable nausea or vomiting, or severe belly pain then go to [facility].  They wanted appt info called to their sponsor/representative [name] at [phone]  They voiced understanding and agreement with plan and recommendations.  Maria Steele was seen today for new patient (initial visit).  Diagnoses and all orders for this visit:  Pregnancy, unspecified gestational age  Abdominal discomfort  Nausea and vomiting, intractability of vomiting not specified, unspecified vomiting type  Other orders -      Prenatal Vit-Fe Fumarate-FA (PRENATAL COMPLETE) 14-0.4 MG TABS; Take 1 tablet by mouth daily.

## 2017-10-10 NOTE — Telephone Encounter (Signed)
done

## 2017-10-10 NOTE — Telephone Encounter (Signed)
Maria Hillarsha- [facility] refer to [name]  This is for prenatal care, likely 3+ months pregnant, no prenatal care up to this point, and she is actively having lower abdominal discomfort, nausea, occasional vomiting  Please send copy of our office notes, the recent labs in the chart as [name] is not on epic  Please let them know that there dialect is either [place] or [place], [place].  The lady [facility] the [facility] at [name] said that they have 2 [place] speaking staff  Her appointment information should be called to their sponsor [name] at [phone].  She is working with them, part of refugee sponsor.     FYI-I let the patient and her husband knows that if her pains worsen in the coming days particular with fever, vaginal bleeding, then go [name].

## 2017-10-11 NOTE — Telephone Encounter (Signed)
Faxed referral to [facility] and include the phone for ms.ksor can  Set up appt for her

## 2017-10-15 ENCOUNTER — Other Ambulatory Visit: Payer: Self-pay | Admitting: Family Medicine

## 2017-10-15 DIAGNOSIS — R1032 Left lower quadrant pain: Secondary | ICD-10-CM

## 2017-10-15 DIAGNOSIS — Z3490 Encounter for supervision of normal pregnancy, unspecified, unspecified trimester: Secondary | ICD-10-CM

## 2017-10-24 ENCOUNTER — Telehealth: Payer: Self-pay | Admitting: Medical

## 2017-10-24 NOTE — Telephone Encounter (Signed)
Based on Sabrina's info from [name] yesterday, have patient go to [facility] for pelvic pain and pregnancy since we can't seem to get any help from anybody.

## 2017-10-24 NOTE — Telephone Encounter (Signed)
See yesterday's telephone message

## 2017-10-25 NOTE — Telephone Encounter (Signed)
Forwarding to shane 

## 2017-10-25 NOTE — Telephone Encounter (Signed)
This was put in referral notes and [name] was contacted to call pt and tell [facility] to [facility]

## 2017-10-25 NOTE — Telephone Encounter (Signed)
See other patient call

## 2017-11-04 ENCOUNTER — Telehealth: Payer: Self-pay | Admitting: Family Medicine

## 2017-11-04 NOTE — Telephone Encounter (Signed)
Please check on this.

## 2017-11-04 NOTE — Telephone Encounter (Signed)
Left message for pts husband to call me back.

## 2017-11-04 NOTE — Telephone Encounter (Signed)
Left message for husband to call me back.   Maria Steele can not fill these forms out since pt is pregnant and she does not know what her physical state is right now and she will need to have her obgyn fill this out.

## 2017-11-04 NOTE — Telephone Encounter (Signed)
Pt husband dropped off forms to be filled out for work, pt can be reached at [phone] when ready to be picked up, put in your folder

## 2017-11-06 ENCOUNTER — Telehealth: Payer: Self-pay | Admitting: Family Medicine

## 2017-11-06 NOTE — Telephone Encounter (Signed)
Left message for contact person on form pt's husband dropped off. After many attempts to contact pt message was left with Cristobal GoldmannPamela Nicholson w/ [employer] at [phone]. This form needs to be completed by pt's ob/gyn.

## 2017-11-06 NOTE — Telephone Encounter (Signed)
Olegario MessierKathy is going to fax back forms and let them know to [facility]

## 2017-11-06 NOTE — Telephone Encounter (Signed)
Tried to call husband again and left a message

## 2017-12-23 ENCOUNTER — Other Ambulatory Visit (HOSPITAL_COMMUNITY): Payer: Self-pay | Admitting: Nurse Practitioner

## 2017-12-23 DIAGNOSIS — Z3689 Encounter for other specified antenatal screening: Secondary | ICD-10-CM

## 2017-12-23 DIAGNOSIS — O09522 Supervision of elderly multigravida, second trimester: Secondary | ICD-10-CM

## 2018-01-01 ENCOUNTER — Encounter (HOSPITAL_COMMUNITY): Payer: Self-pay | Admitting: *Deleted

## 2018-01-03 ENCOUNTER — Ambulatory Visit (HOSPITAL_COMMUNITY)
Admission: RE | Admit: 2018-01-03 | Discharge: 2018-01-03 | Disposition: A | Discharge status: Home / Self Care | Payer: 59 | Source: Ambulatory Visit | Attending: Nurse Practitioner | Admitting: Nurse Practitioner

## 2018-01-03 ENCOUNTER — Other Ambulatory Visit (HOSPITAL_COMMUNITY): Payer: Self-pay | Admitting: Nurse Practitioner

## 2018-01-03 ENCOUNTER — Encounter (HOSPITAL_COMMUNITY): Payer: Self-pay

## 2018-01-03 ENCOUNTER — Other Ambulatory Visit (HOSPITAL_COMMUNITY): Payer: Self-pay | Admitting: *Deleted

## 2018-01-03 DIAGNOSIS — Z3A26 26 weeks gestation of pregnancy: Secondary | ICD-10-CM | POA: Diagnosis not present

## 2018-01-03 DIAGNOSIS — O0932 Supervision of pregnancy with insufficient antenatal care, second trimester: Secondary | ICD-10-CM

## 2018-01-03 DIAGNOSIS — O09522 Supervision of elderly multigravida, second trimester: Secondary | ICD-10-CM

## 2018-01-03 DIAGNOSIS — Z363 Encounter for antenatal screening for malformations: Secondary | ICD-10-CM | POA: Insufficient documentation

## 2018-01-03 DIAGNOSIS — O289 Unspecified abnormal findings on antenatal screening of mother: Secondary | ICD-10-CM | POA: Diagnosis not present

## 2018-01-03 DIAGNOSIS — Z3687 Encounter for antenatal screening for uncertain dates: Secondary | ICD-10-CM | POA: Diagnosis not present

## 2018-01-03 DIAGNOSIS — Z3492 Encounter for supervision of normal pregnancy, unspecified, second trimester: Secondary | ICD-10-CM | POA: Insufficient documentation

## 2018-01-03 DIAGNOSIS — Z3689 Encounter for other specified antenatal screening: Secondary | ICD-10-CM | POA: Insufficient documentation

## 2018-01-03 DIAGNOSIS — O09523 Supervision of elderly multigravida, third trimester: Secondary | ICD-10-CM | POA: Insufficient documentation

## 2018-01-03 DIAGNOSIS — O281 Abnormal biochemical finding on antenatal screening of mother: Secondary | ICD-10-CM

## 2018-01-03 NOTE — Progress Notes (Signed)
Genetic Counseling  High-Risk Gestation Note  Appointment Date:  01/03/2018 Referred By: Trina Ao, NP Date of Birth:  August 09, 1976 Partner:  Lon Puih   Pregnancy History: [id number] Estimated Date of Delivery: 04/06/18  Estimated Gestational Age: [redacted]w[redacted]d Attending: Damaris Hippo, MD   [name] and her partner, Mr. Maria Steele, were seen for genetic counseling because of a maternal age of 42 y.o..  [place] Interpreter, Elenor Legato, provided English/Jarai interpretation for today's visit.   In summary:  Discussed AMA and associated risk for fetal aneuploidy  Ultrasound performed today- see separate report  Estimated Date of Delivery changed to 04/06/18  based on today's ultrasound  Previously performed Quad screen now invalid given updated EDC by ultrasound  Discussed options for screening  NIPS- declined today, may pursue once have Medicaid card  Ultrasound  Discussed diagnostic testing options  Amniocentesis-declined  Reviewed family history concerns  They were counseled regarding maternal age and the association with risk for chromosome conditions due to nondisjunction with aging of the ova.  We reviewed chromosomes, nondisjunction, and the associated 1 in 49 risk for fetal aneuploidy related to a maternal age of 42 y.o. at [redacted]w[redacted]d gestation.  They were counseled that the risk for aneuploidy decreases as gestational age increases, accounting for those pregnancies which spontaneously abort.  We specifically discussed Down syndrome (trisomy 35), trisomies 106 and 62, and sex chromosome aneuploidies (47,XXX and 47,XXY) including the common features and prognoses of each.   [name] previously had Quad screen performed which indicated an elevated MSAFP. However, given that today's ultrasound visualized the pregnancy to be [redacted]w[redacted]d, changing the estimated date of delivery. Thus, the patient's Quad screen is not valid based on the gestational dating from today's ultrasound. Detailed  ultrasound was performed today. Complete ultrasound results under separate cover. There were no visualized fetal anomalies or markers suggestive of aneuploidy. Follow-up ultrasound is planned.   We reviewed available screening options including noninvasive prenatal screening (NIPS)/cell free DNA (cfDNA) screening and detailed ultrasound.  They were counseled that screening tests are used to modify a patient's a priori risk for aneuploidy, typically based on age. This estimate provides a pregnancy specific risk assessment. We reviewed the benefits and limitations of each option. Specifically, we discussed the conditions for which each test screens, the detection rates, and false positive rates of each. They were also counseled regarding diagnostic testing via amniocentesis. We reviewed the approximate 1 in 300-500 risk for complications from amniocentesis, including spontaneous pregnancy loss or preterm labor at this later gestational age. We discussed the possible results that the tests might provide including: positive, negative, unanticipated, and no result. Finally, they were counseled regarding the cost of each option and potential out of pocket expenses. After consideration of all the options, they declined amniocentesis given the complication rate and declined NIPS at this time given potential cost. The patient expressed that she would be interested in possibly pursuing NIPS once she has received her Medicaid card.   They understand that screening tests cannot rule out all birth defects or genetic syndromes. The patient was advised of this limitation and states she still does not want additional testing at this time.   Both family histories were reviewed and found to be noncontributory for birth defects, intellectual disability, and known genetic conditions. The couple reported [place] ancestry and no known consanguinity. Without further information regarding the provided family history, an accurate  genetic risk cannot be calculated. Further genetic counseling is warranted if more information is  obtained.   The father of the pregnancy is 44 years old.  Advanced paternal age (APA) is defined as paternal age greater than or equal to age 44.  Recent large-scale sequencing studies have shown that approximately 80% of de novo point mutations are of paternal origin.  Many studies have demonstrated a strong correlation between increased paternal age and de novo point mutations.  Although no specific data is available regarding fetal risks for fathers 1845+ years old at conception, it is apparent that the overall risk for single gene conditions is increased.  To estimate the relative increase in risk of a genetic disorder with APA, the heritability of the disease must be considered.  Assuming an approximate 2x increase in risk for conditions that are exclusively paternal in origin, the risk for each individual condition is still relatively low.  It is estimated that the overall chance for a de novo mutation is ~0.5%.  There is a wide range of conditions which can be caused by new dominant gene mutations (achondroplasia, neurofibromatosis, Marfan syndrome etc.).      Diagnostic testing for each individual single gene condition is not warranted or available unless ultrasound or concerns lend suspicion to a specific condition. However, there is another NIPS platform ([facility] [employer]) that is able to assess for specific mutations in a panel of 30 selected genes covering 26 conditions. Most of these conditions follow an autosomal dominant pattern of inheritance and typically occur due to de novo gene mutations. The detection rates for these conditions vary depending upon the specific condition but range from 43% to 96%. Therefore, this screening would not identify all new dominant gene mutations. They declined additional screening with [facility].  [name] denied exposure to environmental toxins or chemical  agents. She denied the use of alcohol, tobacco or street drugs. She denied significant viral illnesses during the course of her pregnancy. Her medical and surgical histories were noncontributory.   I counseled this couple regarding the above risks and available options. The approximate face-to-face time with the genetic counselor was 50 minutes.  Quinn PlowmanKaren Sheva Mcdougle, MS,  Certified Genetic Counselor 01/03/2018

## 2018-01-06 ENCOUNTER — Other Ambulatory Visit (HOSPITAL_COMMUNITY): Payer: Self-pay

## 2018-02-14 ENCOUNTER — Ambulatory Visit (HOSPITAL_COMMUNITY)
Admission: RE | Admit: 2018-02-14 | Discharge: 2018-02-14 | Disposition: A | Discharge status: Home / Self Care | Payer: Medicaid Other | Source: Ambulatory Visit | Attending: Nurse Practitioner | Admitting: Nurse Practitioner

## 2018-02-14 ENCOUNTER — Other Ambulatory Visit (HOSPITAL_COMMUNITY): Payer: Self-pay | Admitting: Obstetrics and Gynecology

## 2018-02-14 ENCOUNTER — Other Ambulatory Visit (HOSPITAL_COMMUNITY): Payer: Self-pay | Admitting: *Deleted

## 2018-02-14 ENCOUNTER — Encounter (HOSPITAL_COMMUNITY): Payer: Self-pay

## 2018-02-14 DIAGNOSIS — O09523 Supervision of elderly multigravida, third trimester: Secondary | ICD-10-CM | POA: Insufficient documentation

## 2018-02-14 DIAGNOSIS — Z362 Encounter for other antenatal screening follow-up: Secondary | ICD-10-CM | POA: Diagnosis not present

## 2018-02-14 DIAGNOSIS — O09529 Supervision of elderly multigravida, unspecified trimester: Secondary | ICD-10-CM

## 2018-02-14 DIAGNOSIS — O321XX0 Maternal care for breech presentation, not applicable or unspecified: Secondary | ICD-10-CM | POA: Diagnosis not present

## 2018-02-14 DIAGNOSIS — Z3A32 32 weeks gestation of pregnancy: Secondary | ICD-10-CM

## 2018-02-14 DIAGNOSIS — O09522 Supervision of elderly multigravida, second trimester: Secondary | ICD-10-CM | POA: Insufficient documentation

## 2018-03-14 ENCOUNTER — Encounter (HOSPITAL_COMMUNITY): Payer: Self-pay

## 2018-03-14 ENCOUNTER — Ambulatory Visit (HOSPITAL_COMMUNITY)
Admission: RE | Admit: 2018-03-14 | Discharge: 2018-03-14 | Disposition: A | Discharge status: Home / Self Care | Payer: Medicaid Other | Source: Ambulatory Visit | Attending: Nurse Practitioner | Admitting: Nurse Practitioner

## 2018-03-14 ENCOUNTER — Other Ambulatory Visit (HOSPITAL_COMMUNITY): Payer: Self-pay | Admitting: Obstetrics and Gynecology

## 2018-03-14 DIAGNOSIS — O09529 Supervision of elderly multigravida, unspecified trimester: Secondary | ICD-10-CM

## 2018-03-14 DIAGNOSIS — O09523 Supervision of elderly multigravida, third trimester: Secondary | ICD-10-CM | POA: Diagnosis not present

## 2018-03-14 DIAGNOSIS — Z3A36 36 weeks gestation of pregnancy: Secondary | ICD-10-CM | POA: Diagnosis not present

## 2018-03-17 ENCOUNTER — Encounter (HOSPITAL_COMMUNITY): Payer: Self-pay

## 2018-03-17 ENCOUNTER — Ambulatory Visit (HOSPITAL_COMMUNITY)
Admission: RE | Admit: 2018-03-17 | Discharge: 2018-03-17 | Disposition: A | Discharge status: Home / Self Care | Payer: Medicaid Other | Source: Ambulatory Visit | Attending: Nurse Practitioner | Admitting: Nurse Practitioner

## 2018-03-17 ENCOUNTER — Other Ambulatory Visit (HOSPITAL_COMMUNITY): Payer: Self-pay | Admitting: *Deleted

## 2018-03-17 ENCOUNTER — Other Ambulatory Visit (HOSPITAL_COMMUNITY): Payer: Self-pay | Admitting: Obstetrics and Gynecology

## 2018-03-17 DIAGNOSIS — Z3A37 37 weeks gestation of pregnancy: Secondary | ICD-10-CM | POA: Diagnosis not present

## 2018-03-17 DIAGNOSIS — O09523 Supervision of elderly multigravida, third trimester: Secondary | ICD-10-CM | POA: Insufficient documentation

## 2018-03-17 DIAGNOSIS — Z361 Encounter for antenatal screening for raised alphafetoprotein level: Secondary | ICD-10-CM | POA: Diagnosis not present

## 2018-03-17 DIAGNOSIS — O280 Abnormal hematological finding on antenatal screening of mother: Secondary | ICD-10-CM

## 2018-03-17 DIAGNOSIS — O09529 Supervision of elderly multigravida, unspecified trimester: Secondary | ICD-10-CM

## 2018-03-17 NOTE — Procedures (Signed)
Zoila Locatelli 04/05/1976 1267w1d  Fetus A Non-Stress Test Interpretation for 03/17/18  Indication: Unsatisfactory BPP  Fetal Heart Rate A Mode: External Baseline Rate (A): 130 bpm Variability: Moderate Accelerations: 15 x 15 Decelerations: None Multiple birth?: No  Uterine Activity Mode: Toco Contraction Frequency (min): Erratic Contraction Duration (sec): 10-35 Contraction Quality: Mild Resting Tone Palpated: Relaxed Resting Time: Adequate  Interpretation (Fetal Testing) Nonstress Test Interpretation: Reactive Overall Impression: Reassuring for gestational age Comments: EFM Tracing reviewed by Dr. Judeth CornfieldShankar

## 2018-03-19 ENCOUNTER — Encounter (HOSPITAL_COMMUNITY): Payer: Self-pay

## 2018-03-19 ENCOUNTER — Observation Stay (HOSPITAL_COMMUNITY)
Admission: RE | Admit: 2018-03-19 | Discharge: 2018-03-19 | Disposition: A | Discharge status: Home / Self Care | Payer: Medicaid Other | Source: Ambulatory Visit | Attending: Family Medicine | Admitting: Family Medicine

## 2018-03-19 DIAGNOSIS — O321XX1 Maternal care for breech presentation, fetus 1: Principal | ICD-10-CM | POA: Insufficient documentation

## 2018-03-19 DIAGNOSIS — Z3A37 37 weeks gestation of pregnancy: Secondary | ICD-10-CM | POA: Insufficient documentation

## 2018-03-19 DIAGNOSIS — O321XX0 Maternal care for breech presentation, not applicable or unspecified: Secondary | ICD-10-CM

## 2018-03-19 DIAGNOSIS — O3093 Multiple gestation, unspecified, third trimester: Secondary | ICD-10-CM | POA: Insufficient documentation

## 2018-03-19 MED ORDER — LACTATED RINGERS IV SOLN: INTRAVENOUS | Status: DC

## 2018-03-19 MED ORDER — TERBUTALINE SULFATE 1 MG/ML IJ SOLN
0.2500 mg | Freq: Once | INTRAMUSCULAR | Status: AC
  Administered 2018-03-19: 0.25 mg via SUBCUTANEOUS
  Filled 2018-03-19: qty 1

## 2018-03-19 NOTE — H&P (Signed)
OBSTETRIC ADMISSION HISTORY AND PHYSICAL  Maria Steele is a 42 y.o. female [phone] with IUP at 2455w3d presenting for ECV.   Prenatal History/Complications:  Past Medical History: Past Medical History:  Diagnosis Date  . Acid reflux     Past Surgical History: Past Surgical History:  Procedure Laterality Date  . NO PAST SURGERIES      Obstetrical History: OB History    Gravida  4   Para  3   Term  3   Preterm      AB      Living  3     SAB      TAB      Ectopic      Multiple      Live Births              Social History: Social History   Socioeconomic History  . Marital status: Married    Spouse name: Not on file  . Number of children: Not on file  . Years of education: Not on file  . Highest education level: Not on file  Occupational History  . Not on file  Social Needs  . Financial resource strain: Not on file  . Food insecurity:    Worry: Not on file    Inability: Not on file  . Transportation needs:    Medical: Not on file    Non-medical: Not on file  Tobacco Use  . Smoking status: Never Smoker  . Smokeless tobacco: Never Used  Substance and Sexual Activity  . Alcohol use: No    Frequency: Never  . Drug use: No  . Sexual activity: Not on file  Lifestyle  . Physical activity:    Days per week: Not on file    Minutes per session: Not on file  . Stress: Not on file  Relationships  . Social connections:    Talks on phone: Not on file    Gets together: Not on file    Attends religious service: Not on file    Active member of club or organization: Not on file    Attends meetings of clubs or organizations: Not on file    Relationship status: Not on file  Other Topics Concern  . Not on file  Social History Narrative  . Not on file    Family History: No family history on file.  Allergies: No Known Allergies  Medications Prior to Admission  Medication Sig Dispense Refill Last Dose  . Prenatal Vit-Fe Fumarate-FA (PRENATAL  COMPLETE) 14-0.4 MG TABS Take 1 tablet by mouth daily. 90 each 1 Taking     Review of Systems   All systems reviewed and negative except as stated in HPI  Last menstrual period 07/30/2017. General appearance: alert Lungs: clear to auscultation bilaterally Heart: regular rate and rhythm Abdomen: soft, non-tender; bowel sounds normal. Gravid Extremities: Homans sign is negative, no sign of DVT DTR's wnl Presentation: breech Fetal monitoringBaseline: 135 bpm, Variability: Good {> 6 bpm), Accelerations: Reactive and Decelerations: Absent Uterine activityNone    Patient Active Problem List   Diagnosis Date Noted  . Multigravida of advanced maternal age in third trimester   . Encounter for fetal anatomic survey   . Uncertain dates, antepartum, second trimester   . Abnormal biochemical finding on antenatal screening of mother   . Pregnancy 10/10/2017  . Abdominal discomfort 10/10/2017  . Epigastric pain 04/27/2016  . Tendonitis of elbow, right 04/27/2016  . Loss of weight 04/27/2016  . Elevated liver enzymes  04/27/2016    Assessment/Plan:  Maria Steele is a 42 y.o. G4P3003 at [redacted]w[redacted]d here for ECV for breech presentation- confirmed by bedside US, fetal head in RUQ -terbutaline 0.25 SQ prior to procedure - IV ordered, LR 125 per hr - consented for ECV, reviewed risk/benefits alternatives with help of interpreter at bedside.   Federico Flake, MD  03/19/2018, 9:28 AM

## 2018-03-19 NOTE — Discharge Instructions (Signed)
Provider to contact patient regarding scheduling a cesarean section.

## 2018-03-19 NOTE — Discharge Summary (Signed)
     Discharge Summary     Patient Name: Maria Steele DOB: 08/12/1976 MRN: [id number]  Date of admission: 03/19/2018 Date of discharge: 03/19/2018  Admitting diagnosis: VERSION Intrauterine pregnancy: 6480w3d     Secondary diagnosis:  Active Problems:   Breech presentation   Discharge diagnosis: Breech presentation                                                                                                Hospital course:  Attempted ECV, unsuccessful. pLTCS scheduled.   Physical exam  Vitals:   03/19/18 0935  BP: 133/85  Pulse: 77  Resp: 16   General: alert, cooperative and no distress Uterine Fundus: firm   Labs: Lab Results  Component Value Date   WBC 9.4 08/28/2017   HGB 12.4 08/28/2017   HCT 37.4 08/28/2017   MCV 87.0 08/28/2017   PLT 219 08/28/2017   CMP Latest Ref Rng & Units 08/28/2017  Glucose 65 - 99 mg/dL 74  BUN 7 - 25 mg/dL 11  Creatinine 5.780.50 - 4.691.10 mg/dL 6.290.71  Sodium 528135 - 413146 mmol/L 135  Potassium 3.5 - 5.3 mmol/L 4.3  Chloride 98 - 110 mmol/L 105  CO2 20 - 32 mmol/L 23  Calcium 8.6 - 10.2 mg/dL 2.4(M8.5(L)  Total Protein 6.1 - 8.1 g/dL 6.6  Total Bilirubin 0.2 - 1.2 mg/dL 0.4  Alkaline Phos 33 - 115 U/L -  AST 10 - 30 U/L 21  ALT 6 - 29 U/L 21    Discharge instruction: per After Visit Summary and "Baby and Me Booklet".  After visit meds:  Allergies as of 03/19/2018   No Known Allergies     Medication List    ASK your doctor about these medications   PRENATAL COMPLETE 14-0.4 MG Tabs Take 1 tablet by mouth daily.       Diet: routine diet  Activity: Advance as tolerated. Pelvic rest for 6 weeks.   Outpatient follow up:1wk Follow up Appt: Future Appointments  Date Time Provider Department Center  03/24/2018  3:00 PM [facility] US 3 [facility]  03/31/2018  3:00 PM [facility] US 3 [facility]    03/19/2018 Federico FlakeKimberly Niles Carrin Vannostrand, MD

## 2018-03-19 NOTE — Progress Notes (Signed)
Spoke to Dr. Alvester MorinNewton requesting discharge for patient.

## 2018-03-19 NOTE — Procedures (Signed)
After informed verbal consent, patient signed consent form.  RN administered Terbutaline 0.25 mg SQ  Patient was placed in the supine position. ECV was attempted under Ultrasound guidance. Fetal head located in RUQ with fetal spine on maternal right.  Attempted forward roll toward maternal left without success. Attempted backward roll toward maternal right within fetal bradycardia to the 80s that resolved with fluid and position changes. Patient was informed about the FHR deceleration and once FHR had recovered and was reactive for 5 minutes, I discussed with the patient through the use of the interpreter that we would attempt another forward roll. We were able to get the fetus to cephalic but the fetus immediately converted to breech. Reviewed this with the patient. Dr Therese SarahMeryl Davis was present for the procedure.  ECV was unsuccessfully performed.   FHR was reactive before and after the procedure.   Pt. Tolerated the procedure well.  Plan: Schedule for 39 wk pLTCS for breech Patient would be interested in vaginal breech if in labor-- proven pelvis to 8#13oz  Discussed labor precautions with patient

## 2018-03-21 ENCOUNTER — Ambulatory Visit (HOSPITAL_COMMUNITY): Payer: Medicaid Other

## 2018-03-23 ENCOUNTER — Inpatient Hospital Stay (HOSPITAL_COMMUNITY)
Admission: AD | Admit: 2018-03-23 | Discharge: 2018-03-25 | DRG: 787 | Disposition: A | Discharge status: Home / Self Care | Payer: Medicaid Other | Attending: Obstetrics and Gynecology | Admitting: Obstetrics and Gynecology

## 2018-03-23 ENCOUNTER — Inpatient Hospital Stay (HOSPITAL_COMMUNITY): Payer: Medicaid Other | Admitting: Anesthesiology

## 2018-03-23 ENCOUNTER — Other Ambulatory Visit: Payer: Self-pay

## 2018-03-23 ENCOUNTER — Encounter (HOSPITAL_COMMUNITY)
Admission: AD | Disposition: A | Discharge status: Home / Self Care | Payer: Self-pay | Source: Home / Self Care | Attending: Obstetrics and Gynecology

## 2018-03-23 ENCOUNTER — Encounter (HOSPITAL_COMMUNITY): Payer: Self-pay | Admitting: *Deleted

## 2018-03-23 DIAGNOSIS — O98419 Viral hepatitis complicating pregnancy, unspecified trimester: Secondary | ICD-10-CM

## 2018-03-23 DIAGNOSIS — O09523 Supervision of elderly multigravida, third trimester: Secondary | ICD-10-CM | POA: Diagnosis present

## 2018-03-23 DIAGNOSIS — B181 Chronic viral hepatitis B without delta-agent: Secondary | ICD-10-CM

## 2018-03-23 DIAGNOSIS — O321XX0 Maternal care for breech presentation, not applicable or unspecified: Principal | ICD-10-CM | POA: Diagnosis present

## 2018-03-23 DIAGNOSIS — Z3A38 38 weeks gestation of pregnancy: Secondary | ICD-10-CM | POA: Diagnosis not present

## 2018-03-23 DIAGNOSIS — O9842 Viral hepatitis complicating childbirth: Secondary | ICD-10-CM | POA: Diagnosis present

## 2018-03-23 DIAGNOSIS — B1910 Unspecified viral hepatitis B without hepatic coma: Secondary | ICD-10-CM | POA: Diagnosis not present

## 2018-03-23 DIAGNOSIS — O329XX0 Maternal care for malpresentation of fetus, unspecified, not applicable or unspecified: Secondary | ICD-10-CM | POA: Diagnosis present

## 2018-03-23 DIAGNOSIS — Z3483 Encounter for supervision of other normal pregnancy, third trimester: Secondary | ICD-10-CM | POA: Diagnosis present

## 2018-03-23 LAB — TYPE AND SCREEN
ABO/RH(D): A POS
ANTIBODY SCREEN: NEGATIVE

## 2018-03-23 LAB — CBC
HCT: 39.2 % (ref 36.0–46.0)
Hemoglobin: 12.9 g/dL (ref 12.0–15.0)
MCH: 29.2 pg (ref 26.0–34.0)
MCHC: 32.9 g/dL (ref 30.0–36.0)
MCV: 88.7 fL (ref 78.0–100.0)
Platelets: 146 10*3/uL — ABNORMAL LOW (ref 150–400)
RBC: 4.42 MIL/uL (ref 3.87–5.11)
RDW: 13.4 % (ref 11.5–15.5)
WBC: 9.9 10*3/uL (ref 4.0–10.5)

## 2018-03-23 LAB — CREATININE, SERUM
Creatinine, Ser: 0.82 mg/dL (ref 0.44–1.00)
GFR calc non Af Amer: >60 mL/min (ref >60)

## 2018-03-23 SURGERY — Surgical Case
Anesthesia: Spinal

## 2018-03-23 MED ORDER — NALBUPHINE HCL 10 MG/ML IJ SOLN: 5.0000 mg | Freq: Once | INTRAMUSCULAR | Status: DC | PRN

## 2018-03-23 MED ORDER — DIBUCAINE 1 % RE OINT: 1.0000 "application " | TOPICAL_OINTMENT | RECTAL | Status: DC | PRN

## 2018-03-23 MED ORDER — FENTANYL CITRATE (PF) 100 MCG/2ML IJ SOLN
INTRAMUSCULAR | Status: DC | PRN
  Administered 2018-03-23: 50 ug via INTRAVENOUS
  Administered 2018-03-23: 40 ug via INTRAVENOUS
  Administered 2018-03-23: 10 ug via INTRATHECAL

## 2018-03-23 MED ORDER — BUPIVACAINE HCL 0.5 % IJ SOLN
INTRAMUSCULAR | Status: DC | PRN
  Administered 2018-03-23: 10 mL

## 2018-03-23 MED ORDER — COCONUT OIL OIL: 1.0000 "application " | TOPICAL_OIL | Status: DC | PRN

## 2018-03-23 MED ORDER — FAMOTIDINE IN NACL 20-0.9 MG/50ML-% IV SOLN
20.0000 mg | Freq: Once | INTRAVENOUS | Status: AC
  Administered 2018-03-23: 20 mg via INTRAVENOUS
  Filled 2018-03-23: qty 50

## 2018-03-23 MED ORDER — LACTATED RINGERS IV SOLN
INTRAVENOUS | Status: DC
  Administered 2018-03-23 (×3): via INTRAVENOUS

## 2018-03-23 MED ORDER — BUPIVACAINE IN DEXTROSE 0.75-8.25 % IT SOLN
INTRATHECAL | Status: DC | PRN
  Administered 2018-03-23: 1.5 mL via INTRATHECAL

## 2018-03-23 MED ORDER — LACTATED RINGERS IV SOLN
INTRAVENOUS | Status: DC | PRN
  Administered 2018-03-23: 13:00:00 via INTRAVENOUS

## 2018-03-23 MED ORDER — OXYCODONE HCL 5 MG PO TABS
10.0000 mg | ORAL_TABLET | ORAL | Status: DC | PRN
  Administered 2018-03-24: 10 mg via ORAL
  Filled 2018-03-23: qty 2

## 2018-03-23 MED ORDER — ENOXAPARIN SODIUM 40 MG/0.4ML ~~LOC~~ SOLN
40.0000 mg | SUBCUTANEOUS | Status: DC
  Administered 2018-03-24 – 2018-03-25 (×2): 40 mg via SUBCUTANEOUS
  Filled 2018-03-23 (×2): qty 0.4

## 2018-03-23 MED ORDER — SIMETHICONE 80 MG PO CHEW
80.0000 mg | CHEWABLE_TABLET | Freq: Three times a day (TID) | ORAL | Status: DC
  Administered 2018-03-24 – 2018-03-25 (×5): 80 mg via ORAL
  Filled 2018-03-23 (×5): qty 1

## 2018-03-23 MED ORDER — MORPHINE SULFATE (PF) 0.5 MG/ML IJ SOLN
INTRAMUSCULAR | Status: AC
  Filled 2018-03-23: qty 10

## 2018-03-23 MED ORDER — OXYCODONE HCL 5 MG PO TABS: 5.0000 mg | ORAL_TABLET | Freq: Once | ORAL | Status: DC | PRN

## 2018-03-23 MED ORDER — ONDANSETRON HCL 4 MG/2ML IJ SOLN: 4.0000 mg | Freq: Four times a day (QID) | INTRAMUSCULAR | Status: DC | PRN

## 2018-03-23 MED ORDER — MENTHOL 3 MG MT LOZG: 1.0000 | LOZENGE | OROMUCOSAL | Status: DC | PRN

## 2018-03-23 MED ORDER — PHENYLEPHRINE 8 MG IN D5W 100 ML (0.08MG/ML) PREMIX OPTIME
INJECTION | INTRAVENOUS | Status: DC | PRN
  Administered 2018-03-23: 30 ug/min via INTRAVENOUS

## 2018-03-23 MED ORDER — ONDANSETRON HCL 4 MG/2ML IJ SOLN
4.0000 mg | Freq: Three times a day (TID) | INTRAMUSCULAR | Status: DC | PRN
  Administered 2018-03-23: 4 mg via INTRAVENOUS
  Filled 2018-03-23: qty 2

## 2018-03-23 MED ORDER — SIMETHICONE 80 MG PO CHEW
80.0000 mg | CHEWABLE_TABLET | ORAL | Status: DC
  Administered 2018-03-23 – 2018-03-24 (×2): 80 mg via ORAL
  Filled 2018-03-23 (×2): qty 1

## 2018-03-23 MED ORDER — NALOXONE HCL 4 MG/10ML IJ SOLN
1.0000 ug/kg/h | INTRAVENOUS | Status: DC | PRN
  Filled 2018-03-23: qty 5

## 2018-03-23 MED ORDER — PHENYLEPHRINE 8 MG IN D5W 100 ML (0.08MG/ML) PREMIX OPTIME
INJECTION | INTRAVENOUS | Status: AC
  Filled 2018-03-23: qty 100

## 2018-03-23 MED ORDER — SODIUM CHLORIDE 0.9 % IR SOLN
Status: DC | PRN
  Administered 2018-03-23: 1

## 2018-03-23 MED ORDER — OXYCODONE HCL 5 MG PO TABS
5.0000 mg | ORAL_TABLET | ORAL | Status: DC | PRN
  Administered 2018-03-24 – 2018-03-25 (×5): 5 mg via ORAL
  Filled 2018-03-23 (×6): qty 1

## 2018-03-23 MED ORDER — LACTATED RINGERS IV SOLN: INTRAVENOUS | Status: DC

## 2018-03-23 MED ORDER — ACETAMINOPHEN 325 MG PO TABS
650.0000 mg | ORAL_TABLET | ORAL | Status: DC | PRN
  Administered 2018-03-24 – 2018-03-25 (×3): 650 mg via ORAL
  Filled 2018-03-23 (×3): qty 2

## 2018-03-23 MED ORDER — MORPHINE SULFATE (PF) 0.5 MG/ML IJ SOLN
INTRAMUSCULAR | Status: DC | PRN
  Administered 2018-03-23: .2 mg via INTRATHECAL

## 2018-03-23 MED ORDER — DIPHENHYDRAMINE HCL 25 MG PO CAPS: 25.0000 mg | ORAL_CAPSULE | Freq: Four times a day (QID) | ORAL | Status: DC | PRN

## 2018-03-23 MED ORDER — KETOROLAC TROMETHAMINE 30 MG/ML IJ SOLN
30.0000 mg | Freq: Four times a day (QID) | INTRAMUSCULAR | Status: AC | PRN
  Administered 2018-03-23: 30 mg via INTRAMUSCULAR
  Filled 2018-03-23: qty 1

## 2018-03-23 MED ORDER — SODIUM CHLORIDE 0.9% FLUSH: 3.0000 mL | INTRAVENOUS | Status: DC | PRN

## 2018-03-23 MED ORDER — SOD CITRATE-CITRIC ACID 500-334 MG/5ML PO SOLN: 30.0000 mL | ORAL | Status: DC

## 2018-03-23 MED ORDER — IBUPROFEN 600 MG PO TABS
600.0000 mg | ORAL_TABLET | Freq: Four times a day (QID) | ORAL | Status: DC
  Administered 2018-03-23 – 2018-03-25 (×7): 600 mg via ORAL
  Filled 2018-03-23 (×7): qty 1

## 2018-03-23 MED ORDER — ONDANSETRON HCL 4 MG/2ML IJ SOLN
INTRAMUSCULAR | Status: AC
  Filled 2018-03-23: qty 2

## 2018-03-23 MED ORDER — FENTANYL CITRATE (PF) 100 MCG/2ML IJ SOLN: 25.0000 ug | INTRAMUSCULAR | Status: DC | PRN

## 2018-03-23 MED ORDER — SIMETHICONE 80 MG PO CHEW: 80.0000 mg | CHEWABLE_TABLET | ORAL | Status: DC | PRN

## 2018-03-23 MED ORDER — LACTATED RINGERS IV BOLUS
1000.0000 mL | Freq: Once | INTRAVENOUS | Status: AC
  Administered 2018-03-23: 1000 mL via INTRAVENOUS

## 2018-03-23 MED ORDER — MEPERIDINE HCL 25 MG/ML IJ SOLN: 6.2500 mg | INTRAMUSCULAR | Status: DC | PRN

## 2018-03-23 MED ORDER — BUPIVACAINE HCL (PF) 0.5 % IJ SOLN
INTRAMUSCULAR | Status: AC
  Filled 2018-03-23: qty 30

## 2018-03-23 MED ORDER — NALBUPHINE HCL 10 MG/ML IJ SOLN: 5.0000 mg | INTRAMUSCULAR | Status: DC | PRN

## 2018-03-23 MED ORDER — ZOLPIDEM TARTRATE 5 MG PO TABS: 5.0000 mg | ORAL_TABLET | Freq: Every evening | ORAL | Status: DC | PRN

## 2018-03-23 MED ORDER — DIPHENHYDRAMINE HCL 50 MG/ML IJ SOLN: 12.5000 mg | INTRAMUSCULAR | Status: DC | PRN

## 2018-03-23 MED ORDER — SOD CITRATE-CITRIC ACID 500-334 MG/5ML PO SOLN
30.0000 mL | Freq: Once | ORAL | Status: AC
  Administered 2018-03-23: 30 mL via ORAL
  Filled 2018-03-23: qty 15

## 2018-03-23 MED ORDER — OXYTOCIN 10 UNIT/ML IJ SOLN
INTRAMUSCULAR | Status: AC
  Filled 2018-03-23: qty 4

## 2018-03-23 MED ORDER — OXYTOCIN 10 UNIT/ML IJ SOLN
INTRAMUSCULAR | Status: DC | PRN
  Administered 2018-03-23: 40 [IU] via INTRAVENOUS

## 2018-03-23 MED ORDER — SENNOSIDES-DOCUSATE SODIUM 8.6-50 MG PO TABS
2.0000 | ORAL_TABLET | ORAL | Status: DC
  Administered 2018-03-23 – 2018-03-24 (×2): 2 via ORAL
  Filled 2018-03-23 (×2): qty 2

## 2018-03-23 MED ORDER — FENTANYL CITRATE (PF) 100 MCG/2ML IJ SOLN
INTRAMUSCULAR | Status: AC
  Filled 2018-03-23: qty 2

## 2018-03-23 MED ORDER — DIPHENHYDRAMINE HCL 25 MG PO CAPS: 25.0000 mg | ORAL_CAPSULE | ORAL | Status: DC | PRN

## 2018-03-23 MED ORDER — OXYTOCIN 40 UNITS IN LACTATED RINGERS INFUSION - SIMPLE MED: 2.5000 [IU]/h | INTRAVENOUS | Status: AC

## 2018-03-23 MED ORDER — CEFAZOLIN SODIUM-DEXTROSE 2-4 GM/100ML-% IV SOLN
2.0000 g | INTRAVENOUS | Status: AC
  Administered 2018-03-23: 2 g via INTRAVENOUS
  Filled 2018-03-23: qty 100

## 2018-03-23 MED ORDER — SCOPOLAMINE 1 MG/3DAYS TD PT72
1.0000 | MEDICATED_PATCH | Freq: Once | TRANSDERMAL | Status: DC
  Filled 2018-03-23: qty 1

## 2018-03-23 MED ORDER — KETOROLAC TROMETHAMINE 30 MG/ML IJ SOLN: 30.0000 mg | Freq: Four times a day (QID) | INTRAMUSCULAR | Status: AC | PRN

## 2018-03-23 MED ORDER — OXYCODONE HCL 5 MG/5ML PO SOLN: 5.0000 mg | Freq: Once | ORAL | Status: DC | PRN

## 2018-03-23 MED ORDER — NALOXONE HCL 0.4 MG/ML IJ SOLN: 0.4000 mg | INTRAMUSCULAR | Status: DC | PRN

## 2018-03-23 MED ORDER — PRENATAL MULTIVITAMIN CH
1.0000 | ORAL_TABLET | Freq: Every day | ORAL | Status: DC
  Administered 2018-03-24 – 2018-03-25 (×2): 1 via ORAL
  Filled 2018-03-23 (×2): qty 1

## 2018-03-23 MED ORDER — WITCH HAZEL-GLYCERIN EX PADS: 1.0000 "application " | MEDICATED_PAD | CUTANEOUS | Status: DC | PRN

## 2018-03-23 MED ORDER — TETANUS-DIPHTH-ACELL PERTUSSIS 5-2.5-18.5 LF-MCG/0.5 IM SUSP: 0.5000 mL | Freq: Once | INTRAMUSCULAR | Status: DC

## 2018-03-23 SURGICAL SUPPLY — 40 items
CHLORAPREP W/TINT 26ML (MISCELLANEOUS) ×4 IMPLANT
CLAMP CORD UMBIL (MISCELLANEOUS) IMPLANT
CLOTH BEACON ORANGE TIMEOUT ST (SAFETY) ×2 IMPLANT
DERMABOND ADVANCED (GAUZE/BANDAGES/DRESSINGS) ×2
DERMABOND ADVANCED .7 DNX12 (GAUZE/BANDAGES/DRESSINGS) ×2 IMPLANT
DRSG OPSITE POSTOP 4X10 (GAUZE/BANDAGES/DRESSINGS) ×2 IMPLANT
ELECT REM PT RETURN 9FT ADLT (ELECTROSURGICAL) ×2
ELECTRODE REM PT RTRN 9FT ADLT (ELECTROSURGICAL) ×1 IMPLANT
EXTRACTOR VACUUM BELL STYLE (SUCTIONS) IMPLANT
GLOVE BIOGEL PI IND STRL 7.0 (GLOVE) ×1 IMPLANT
GLOVE BIOGEL PI IND STRL 8 (GLOVE) ×1 IMPLANT
GLOVE BIOGEL PI INDICATOR 7.0 (GLOVE) ×1
GLOVE BIOGEL PI INDICATOR 8 (GLOVE) ×1
GLOVE ECLIPSE 8.0 STRL XLNG CF (GLOVE) ×2 IMPLANT
GOWN STRL REUS W/TWL LRG LVL3 (GOWN DISPOSABLE) ×4 IMPLANT
HEMOSTAT ARISTA ABSORB 3G PWDR (MISCELLANEOUS) ×2 IMPLANT
KIT ABG SYR 3ML LUER SLIP (SYRINGE) ×2 IMPLANT
NEEDLE HYPO 18GX1.5 BLUNT FILL (NEEDLE) ×2 IMPLANT
NEEDLE HYPO 22GX1.5 SAFETY (NEEDLE) ×2 IMPLANT
NEEDLE HYPO 25X5/8 SAFETYGLIDE (NEEDLE) ×2 IMPLANT
NS IRRIG 1000ML POUR BTL (IV SOLUTION) ×2 IMPLANT
PACK C SECTION WH (CUSTOM PROCEDURE TRAY) ×2 IMPLANT
PAD OB MATERNITY 4.3X12.25 (PERSONAL CARE ITEMS) ×2 IMPLANT
PENCIL SMOKE EVAC W/HOLSTER (ELECTROSURGICAL) ×2 IMPLANT
RTRCTR C-SECT PINK 25CM LRG (MISCELLANEOUS) IMPLANT
SUT CHROMIC 0 CT 1 (SUTURE) ×2 IMPLANT
SUT MNCRL 0 VIOLET CTX 36 (SUTURE) ×2 IMPLANT
SUT MONOCRYL 0 CTX 36 (SUTURE) ×2
SUT PLAIN 2 0 (SUTURE)
SUT PLAIN 2 0 XLH (SUTURE) IMPLANT
SUT PLAIN ABS 2-0 CT1 27XMFL (SUTURE) IMPLANT
SUT VIC AB 0 CT1 36 (SUTURE) ×2 IMPLANT
SUT VIC AB 0 CTX 36 (SUTURE) ×1
SUT VIC AB 0 CTX36XBRD ANBCTRL (SUTURE) ×1 IMPLANT
SUT VIC AB 2-0 CT1 27 (SUTURE) ×1
SUT VIC AB 2-0 CT1 TAPERPNT 27 (SUTURE) ×1 IMPLANT
SUT VIC AB 4-0 KS 27 (SUTURE) IMPLANT
SYR 20CC LL (SYRINGE) ×4 IMPLANT
TOWEL OR 17X24 6PK STRL BLUE (TOWEL DISPOSABLE) ×2 IMPLANT
TRAY FOLEY W/BAG SLVR 14FR LF (SET/KITS/TRAYS/PACK) IMPLANT

## 2018-03-23 NOTE — Anesthesia Postprocedure Evaluation (Signed)
Anesthesia Post Note  Patient: Geographical information systems officerBlach Krisko  Procedure(s) Performed: CESAREAN SECTION (N/A )     Patient location during evaluation: Mother Baby Anesthesia Type: Spinal Level of consciousness: awake and alert and oriented Pain management: satisfactory to patient Vital Signs Assessment: post-procedure vital signs reviewed and stable Respiratory status: respiratory function stable and spontaneous breathing Cardiovascular status: blood pressure returned to baseline Postop Assessment: no headache, no backache, spinal receding, patient able to bend at knees and adequate PO intake Anesthetic complications: no    Last Vitals:  Vitals:   03/23/18 1623 03/23/18 1727  BP: 116/77 116/70  Pulse: (!) 57 64  Resp: 16 16  Temp: 36.4 C (!) 36.3 C  SpO2: 100% 99%    Last Pain:  Vitals:   03/23/18 1727  TempSrc: Oral  PainSc:    Pain Goal:                 Samael Blades

## 2018-03-23 NOTE — Op Note (Signed)
Maria Steele PROCEDURE DATE: 03/23/2018  PREOPERATIVE DIAGNOSES: Intrauterine pregnancy at [redacted]w[redacted]d weeks gestation; malpresentation: frank breech  POSTOPERATIVE DIAGNOSES: The same  PROCEDURE: Primary Low Transverse Cesarean Section  SURGEON:  Baldemar Lenis, MD  ASSISTANT:  none  ANESTHESIOLOGY TEAM: Anesthesiologist: Achille Rich, MD CRNA: Shanon Payor, CRNA  INDICATIONS: Maria Steele is a 42 y.o. [phone] at [redacted]w[redacted]d here for cesarean section secondary to the indications listed under preoperative diagnoses; please see preoperative note for further details.  The risks of cesarean section were discussed with the patient including but were not limited to: bleeding which may require transfusion or reoperation; infection which may require antibiotics; injury to bowel, bladder, ureters or other surrounding organs; injury to the fetus; need for additional procedures including hysterectomy in the event of a life-threatening hemorrhage; placental abnormalities wth subsequent pregnancies, incisional problems, thromboembolic phenomenon and other postoperative/anesthesia complications.  She consents to blood transfusion in the event of an emergency. The patient verbalized understanding of the plan, giving informed written consent for the procedure.    FINDINGS:  Viable female infant in cephalic presentation.  Apgars 8 and 9.  Clear amniotic fluid.  Intact placenta, three vessel cord.  Uterus with slight heart shape at fundus, otherwise normal appearing, fallopian tubes and ovaries bilaterally.  ANESTHESIA: Spinal  INTRAVENOUS FLUIDS: 2300 ml   ESTIMATED BLOOD LOSS: 771 ml URINE OUTPUT:  175  ml SPECIMENS: Placenta sent to L&D COMPLICATIONS: None immediate  PROCEDURE IN DETAIL:  The patient preoperatively received intravenous antibiotics and had sequential compression devices applied to her lower extremities.  She was then taken to the operating room where spinal anesthesia was administered and was found  to be adequate. She was then placed in a dorsal supine position with a leftward tilt, and prepped and draped in a sterile manner.  A foley catheter was placed into her bladder with sterile technique and attached to constant gravity.  After a timeout was performed, a Pfannenstiel skin incision was made with scalpel and carried through to the underlying layer of fascia. The fascia was incised in the midline, and this incision was extended bilaterally using the Mayo scissors.  Kocher clamps were applied to the superior aspect of the fascial incision and the underlying rectus muscles were dissected off bluntly.  A similar process was carried out on the inferior aspect of the fascial incision. The rectus muscles were separated in the midline bluntly and the peritoneum was entered sharply. The peritoneal incision was carefully extended bluntly laterally and caudad with good visualization of the bladder. The uterus appeared normal. An Alexis retractor was placed into the incision and palpated to ensure no bowel entrapped. Attention was turned to the lower uterine segment where a low transverse hysterotomy was made with a scalpel and extended bilaterally bluntly.  The infant was delivered from frank breech position. The buttocks were lifted out of the pelvis and delivered easily, the right arm was gently swept down and delivered and then the infant was rotated and the left arm gently swept down and delivered. The head delivered easily with fundal pressure. The infant was vigorous from delivery and nose and mouth were bulb suctioned, and the cord clamped and cut after 1 minute. The infant was then handed over to the waiting neonatology team. Uterine massage was then performed, and the placenta delivered intact with a three-vessel cord. The hysterotomy was closed with 0 Vicryl in a running locked fashion, and an imbricating layer was also placed with 0 Vicryl. Several figure-of-eight 0 Vicryl  serosal stitches were placed to  help with hemostasis.  The fallopian tubes and ovaries were visualized bilaterally and normal appearing.   The pelvis was cleared of all clot and debris. Arista was placed over the hysterotomy for additional hemostasis.  Hemostasis was again confirmed on all surfaces. The peritoneum was re-approximated using 2-0 Vicryl suture. The fascia was then closed using 0 Vicryl in a running fashion.  The subcutaneous layer was irrigated, then reapproximated with 2-0 plain gut, and 10 ml of 0.5% Marcaine was injected subcutaneously around the incision.  The skin was closed with a 4-0 Monocryl subcuticular stitch. The patient tolerated the procedure well. Sponge, lap, instrument and needle counts were correct x 3.  She was taken to the recovery room in stable condition.    Baldemar LenisK. Meryl Melissia Lahman, M.D. Attending Obstetrician & Gynecologist, [facility] for [facility], [facility]

## 2018-03-23 NOTE — Anesthesia Preprocedure Evaluation (Signed)
Anesthesia Evaluation  Patient identified by MRN, date of birth, ID band Patient awake    Reviewed: Allergy & Precautions, H&P , NPO status , Patient's Chart, lab work & pertinent test results  Airway Mallampati: II   Neck ROM: full    Dental   Pulmonary neg pulmonary ROS,    breath sounds clear to auscultation       Cardiovascular negative cardio ROS   Rhythm:regular Rate:Normal     Neuro/Psych    GI/Hepatic GERD  ,  Endo/Other    Renal/GU      Musculoskeletal   Abdominal   Peds  Hematology   Anesthesia Other Findings   Reproductive/Obstetrics (+) Pregnancy                             Anesthesia Physical Anesthesia Plan  ASA: II and emergent  Anesthesia Plan: Spinal   Post-op Pain Management:    Induction: Intravenous  PONV Risk Score and Plan: 2 and Ondansetron, Treatment may vary due to age or medical condition and Scopolamine patch - Pre-op  Airway Management Planned: Nasal Cannula  Additional Equipment:   Intra-op Plan:   Post-operative Plan:   Informed Consent: I have reviewed the patients History and Physical, chart, labs and discussed the procedure including the risks, benefits and alternatives for the proposed anesthesia with the patient or authorized representative who has indicated his/her understanding and acceptance.     Plan Discussed with: CRNA, Anesthesiologist and Surgeon  Anesthesia Plan Comments:         Anesthesia Quick Evaluation

## 2018-03-23 NOTE — Transfer of Care (Signed)
Immediate Anesthesia Transfer of Care Note  Patient: Maria Steele  Procedure(s) Performed: CESAREAN SECTION (N/A )  Patient Location: PACU  Anesthesia Type:Spinal  Level of Consciousness: awake, alert  and oriented  Airway & Oxygen Therapy: Patient Spontanous Breathing  Post-op Assessment: Report given to RN and Post -op Vital signs reviewed and stable  Post vital signs: Reviewed and stable  Last Vitals:  Vitals Value Taken Time  BP    Temp    Pulse    Resp    SpO2      Last Pain: There were no vitals filed for this visit.       Complications: No apparent anesthesia complications

## 2018-03-23 NOTE — MAU Note (Signed)
Pt presents to MAU with complaints of contractions

## 2018-03-23 NOTE — H&P (Addendum)
Maria Steele is a 42 y.o. female [phone] @ 38 wks in with c/o active labor since early this morning. SROM on admission ro unit. GBS neg , Hep B pos OB History    Gravida  4   Para  3   Term  3   Preterm      AB      Living  3     SAB      TAB      Ectopic      Multiple      Live Births             Past Medical History:  Diagnosis Date  . Acid reflux    Past Surgical History:  Procedure Laterality Date  . NO PAST SURGERIES     Family History: family history is not on file. Social History:  reports that she has never smoked. She has never used smokeless tobacco. She reports that she does not drink alcohol or use drugs.     Maternal Diabetes: No Genetic Screening: Normal Maternal Ultrasounds/Referrals: Normal Fetal Ultrasounds or other Referrals:  None Maternal Substance Abuse:  No Significant Maternal Medications:  None Significant Maternal Lab Results:  Lab values include: Other:  hep B pos Other Comments:  None  ROS Maternal Medical History:  Reason for admission: Rupture of membranes and contractions.   Contractions: Onset was 3-5 hours ago.   Frequency: regular.   Perceived severity is mild.    Fetal activity: Perceived fetal activity is normal.   Last perceived fetal movement was within the past hour.    Prenatal complications: Hep C pos  Prenatal Complications - Diabetes: none.      Last menstrual period 07/30/2017. Maternal Exam:  Uterine Assessment: Contraction strength is moderate.  Abdomen: Patient reports no abdominal tenderness. Fetal presentation: breech  Introitus: Normal vulva. Normal vagina.  Ferning test: not done.  Amniotic fluid character: clear.  Pelvis: adequate for delivery.   Cervix: Cervix evaluated by digital exam.     Fetal Exam Fetal Monitor Review: Mode: ultrasound.   Variability: moderate (6-25 bpm).   Pattern: accelerations present.    Fetal State Assessment: Category I - tracings are  normal.     Physical Exam  Constitutional: She is oriented to person, place, and time. She appears well-developed.  HENT:  Head: Normocephalic.  Neck: Normal range of motion.  Cardiovascular: Normal rate, regular rhythm, normal heart sounds and intact distal pulses.  Respiratory: Effort normal and breath sounds normal.  GI: Soft. Bowel sounds are normal.  Genitourinary: Vagina normal and uterus normal.  Musculoskeletal: Normal range of motion.  Neurological: She is alert and oriented to person, place, and time. She has normal reflexes.  Skin: Skin is warm and dry.  Psychiatric: She has a normal mood and affect. Her behavior is normal. Judgment and thought content normal.    Prenatal labs: ABO, Rh:  A positive Antibody:  negative Rubella:  immune RPR:   nmegative HBsAg:   positive HIV:   negative GBS:     Assessment/Plan: Active labor gbs neg Hep B pos Breech presentatio    Maria Steele 03/23/2018, 10:56 AM    Attestation of Attending Supervision of Advanced Practitioner (PA/CNM/NP): Evaluation and management procedures were performed by the Advanced Practitioner under my supervision and collaboration.  I have reviewed the Advanced Practitioner's note and chart, and I agree with the management and plan.  Patient 6/80/0 and ruptured. She is contracting regularly and uncomfortable.   Hep B carrier,  last PCR quant 65K on 03/18/18.  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 Reviewed risks/benefits of vaginal breech delivery versus primary c-section. Reviewed risks of c-section with patient and husband including risk of infection, bleeding, damage to surrounding tissue and organs, implications for future pregnancies, they verbalize understanding and would like to proceed with primary c-section.  Answered all questions.   Ancef 2 gms To OR when ready  K. Therese Sarah, M.D. Attending Obstetrician & Gynecologist, [facility] for [employer], [facility]  03/23/2018 11:55 AM

## 2018-03-23 NOTE — Anesthesia Procedure Notes (Signed)
Spinal  Patient location during procedure: OR Start time: 03/23/2018 12:27 PM End time: 03/23/2018 12:29 PM Staffing Anesthesiologist: Achille RichHodierne, Grant Swager, MD Performed: anesthesiologist  Preanesthetic Checklist Completed: patient identified, surgical consent, pre-op evaluation, timeout performed, IV checked, risks and benefits discussed and monitors and equipment checked Spinal Block Patient position: sitting Prep: DuraPrep Patient monitoring: cardiac monitor, continuous pulse ox and blood pressure Approach: midline Location: L3-4 Injection technique: single-shot Needle Needle type: Pencan  Needle gauge: 24 G Needle length: 9 cm Assessment Sensory level: T10 Additional Notes Functioning IV was confirmed and monitors were applied. Sterile prep and drape, including hand hygiene and sterile gloves were used. The patient was positioned and the spine was prepped. The skin was anesthetized with lidocaine.  Free flow of clear CSF was obtained prior to injecting local anesthetic into the CSF.  The spinal needle aspirated freely following injection.  The needle was carefully withdrawn.  The patient tolerated the procedure well.

## 2018-03-24 ENCOUNTER — Ambulatory Visit (HOSPITAL_COMMUNITY)
Admission: RE | Admit: 2018-03-24 | Discharge: 2018-03-24 | Disposition: A | Discharge status: Home / Self Care | Payer: Medicaid Other | Source: Ambulatory Visit | Attending: Nurse Practitioner | Admitting: Nurse Practitioner

## 2018-03-24 ENCOUNTER — Ambulatory Visit (HOSPITAL_COMMUNITY): Payer: Medicaid Other

## 2018-03-24 ENCOUNTER — Encounter (HOSPITAL_COMMUNITY): Payer: Self-pay

## 2018-03-24 ENCOUNTER — Encounter (HOSPITAL_COMMUNITY): Payer: Self-pay | Admitting: *Deleted

## 2018-03-24 LAB — CBC
HEMATOCRIT: 30 % — AB (ref 36.0–46.0)
Hemoglobin: 10.2 g/dL — ABNORMAL LOW (ref 12.0–15.0)
MCH: 29.8 pg (ref 26.0–34.0)
MCHC: 34 g/dL (ref 30.0–36.0)
MCV: 87.7 fL (ref 78.0–100.0)
PLATELETS: 138 10*3/uL — AB (ref 150–400)
RBC: 3.42 MIL/uL — ABNORMAL LOW (ref 3.87–5.11)
RDW: 13.3 % (ref 11.5–15.5)
WBC: 12.3 10*3/uL — ABNORMAL HIGH (ref 4.0–10.5)

## 2018-03-24 LAB — RPR: RPR Ser Ql: NONREACTIVE

## 2018-03-24 NOTE — Lactation Note (Signed)
This note was copied from a baby's chart. Lactation Consultation Note:  Jahri language interpreter " Lek" at the bedside for all teaching. Mother is a P4, she reports that she breastfed her first two children for 5-6 yrs. She reports that her third child was breastfed and supplemented, but then breastfed for 5-6 yrs. Mothers last child is 717 yrs old. This is her first daughter. She is very happy.   Mother reports that she has been bottle feeding this child. She was unsure if her colostrum was clean. She hand expressed large drops of creamy yellow colostrum. Explained to mother that her this is her first milk and that it is very healthy for infant to get her first milk.   Mother request assistance with latching infant. Several attempts by mother to latch infant in cross cradle hold on the left breast.  Mothers left nipple appears erect until she compresses and then it slightly inverts. Infant had difficulty sustaining latch on this breast.  Mother taught cross cradle and football. She preferred football. Infant  latched on the rt breast and sustained latch for 25 mins. Infant was given 2-3 ml of formula with a curved tip syringe to entice to breastfeed.  Infant was observed with good suckling and swallowing patterns. Mother taught breast compression. Assist mother with hand expression.  Infant was fed 9 ml of ebm with a spoon.    She was given a harmony hand pump to pump if desired to supplement infant. Mother prefers to use her hand and hand express as needed.   Mother advised to continue to do frequent skin to skin and educated about benefits. Advised mother to breastfeed infant 8-12 times in 24 hours. Discussed cluster feeding.  Mother was given Lactation Science writerBrochure and [facility]. Mother receptive to all teaching.   Patient Name: Maria Steele ZOXWR'UToday's Date: 03/24/2018 Reason for consult: Initial assessment   Maternal Data    Feeding Feeding Type: Bottle Fed -  Formula Nipple Type: Slow - flow  LATCH Score                   Interventions    Lactation Tools Discussed/Used     Consult Status      Michel BickersKendrick, Morley Gaumer McCoy 03/24/2018, 12:09 PM

## 2018-03-24 NOTE — Progress Notes (Addendum)
POSTPARTUM PROGRESS NOTE  Post Partum Day 1 Subjective: Interview performed through live McKenzieJarai interpreter.  Maria Steele is a 42 y.o. [id number] 4734w1d s/p pLTCS for breech presentation.  Patient had some vomiting last night but has since tolerated a diet. Pt denies problems with ambulating, voiding or po intake.  Pain is moderately controlled. Lochia Small.   Objective: Blood pressure 106/66, pulse (!) 54, temperature 98 F (36.7 C), temperature source Oral, resp. rate 18, last menstrual period 07/30/2017, SpO2 99 %.  Physical Exam:  General: alert, cooperative and no distress Lochia:normal flow Chest: no respiratory distress Heart:regular rate, distal pulses intact Abdomen: soft, nontender,  Uterine Fundus: firm, appropriately tender.  Incision: Dressing in place with minimal saturation. DVT Evaluation: No calf swelling or tenderness Extremities: no edema  Recent Labs    03/23/18 1151 03/24/18 0541  HGB 12.9 10.2*  HCT 39.2 30.0*    Assessment/Plan:  ASSESSMENT: Maria Steele is a 42 y.o. [id number] 5234w1d s/p pLTCS for breech presentation.  Breastfeeding with some formula supplementation. Will receive more lactation education. Contraception: Nexplanon Plan to discharge tomorrow.   LOS: 1 day   Ellwood Denselison Rumball, DO 03/24/2018, 11:14 AM   I confirm that I have verified the information documented in the resident's note and that I have also personally reperformed the physical exam and all medical decision making activities. Patient was seen and examined by me also Agree with note Vitals stable Labs stable Fundus firm, lochia within normal limits Wound healing Ext WNL Continue care Anticipate discharge tomorrow or following day  Aviva SignsWilliams, Brodey Bonn L, CNM

## 2018-03-25 DIAGNOSIS — O98419 Viral hepatitis complicating pregnancy, unspecified trimester: Secondary | ICD-10-CM

## 2018-03-25 DIAGNOSIS — B181 Chronic viral hepatitis B without delta-agent: Secondary | ICD-10-CM

## 2018-03-25 DIAGNOSIS — O329XX0 Maternal care for malpresentation of fetus, unspecified, not applicable or unspecified: Secondary | ICD-10-CM | POA: Diagnosis present

## 2018-03-25 MED ORDER — IBUPROFEN 600 MG PO TABS: 600.0000 mg | ORAL_TABLET | Freq: Four times a day (QID) | ORAL | 0 refills | Status: AC | PRN

## 2018-03-25 MED ORDER — OXYCODONE HCL 5 MG PO TABS: 5.0000 mg | ORAL_TABLET | ORAL | 0 refills | Status: AC | PRN

## 2018-03-25 NOTE — Discharge Summary (Signed)
OB Discharge Summary     Patient Name: Maria Steele DOB: 1975-11-10 MRN: [id number]  Date of admission: 03/23/2018 Delivering MD: Conan Bowens   Date of discharge: 03/25/2018  Admitting diagnosis: PREG CTX  Intrauterine pregnancy: [redacted]w[redacted]d     Secondary diagnosis:  Active Problems:   Multigravida of advanced maternal age in third trimester   Breech presentation   Engagement of fetus in breech position   Maternal hepatitis B surface antigen positive, currently pregnant (HCC)   Failed external cephalic version  Additional problems: none     Discharge diagnosis: Term Pregnancy Delivered                                                                                                Post partum procedures:none  Augmentation: N/A  Complications: None  Hospital course:  Sceduled C/S   42 y.o. yo [id number] at [redacted]w[redacted]d was admitted to the hospital 03/23/2018 for scheduled cesarean section with the following indication:Malpresentation. She had an ECV attempted on 03/19/18 that was unsuccessful. Membrane Rupture Time/Date: 10:50 AM ,03/23/2018   Patient delivered a Viable infant.03/23/2018  Details of operation can be found in separate operative note.  Pateint had an uncomplicated postpartum course.  She is ambulating, tolerating a regular diet, passing flatus, and urinating well. Patient is discharged home in stable condition on  03/25/18- early d/c per pt request.         Physical exam  Vitals:   03/24/18 0819 03/24/18 1431 03/24/18 2251 03/25/18 0627  BP: 106/66 114/67 123/73 126/79  Pulse: (!) 54 74 (!) 59 62  Resp: 18 20 16 18   Temp: 98 F (36.7 C) 97.9 F (36.6 C) 98.1 F (36.7 C)   TempSrc: Oral Oral Oral   SpO2: 99% 99%     General: alert and cooperative Lochia: appropriate Uterine Fundus: firm Incision: honeycomb intact, approx 75% stained/unchanged DVT Evaluation: No evidence of DVT seen on physical exam. Labs: Lab Results  Component Value Date   WBC 12.3 (H) 03/24/2018   HGB  10.2 (L) 03/24/2018   HCT 30.0 (L) 03/24/2018   MCV 87.7 03/24/2018   PLT 138 (L) 03/24/2018   CMP Latest Ref Rng & Units 03/23/2018  Glucose 65 - 99 mg/dL -  BUN 7 - 25 mg/dL -  Creatinine 6.57 - 8.46 mg/dL 9.62  Sodium 952 - 841 mmol/L -  Potassium 3.5 - 5.3 mmol/L -  Chloride 98 - 110 mmol/L -  CO2 20 - 32 mmol/L -  Calcium 8.6 - 10.2 mg/dL -  Total Protein 6.1 - 8.1 g/dL -  Total Bilirubin 0.2 - 1.2 mg/dL -  Alkaline Phos 33 - 324 U/L -  AST 10 - 30 U/L -  ALT 6 - 29 U/L -    Discharge instruction: per After Visit Summary and "Baby and Me Booklet".  After visit meds:  Allergies as of 03/25/2018   No Known Allergies     Medication List    TAKE these medications   ibuprofen 600 MG tablet Commonly known as:  ADVIL,MOTRIN Take 1 tablet (600 mg total) by mouth every  6 (six) hours as needed.   oxyCODONE 5 MG immediate release tablet Commonly known as:  Oxy IR/ROXICODONE Take 1 tablet (5 mg total) by mouth every 4 (four) hours as needed (pain scale 4-7).   PRENATAL COMPLETE 14-0.4 MG Tabs Take 1 tablet by mouth daily.       Diet: routine diet  Activity: Advance as tolerated. Pelvic rest for 6 weeks.   Outpatient follow up:1-2wks for inc check; 4-6 wks for PP visit Follow up Appt:No future appointments. Follow up Visit:No follow-ups on file.  Postpartum contraception: Nexplanon  Newborn Data: Live born female  Birth Weight: 6 lb 1.2 oz (2755 g) APGAR: 8, 9  Newborn Delivery   Birth date/time:  03/23/2018 12:48:00 Delivery type:  C-Section, Low Transverse Trial of labor:  No C-section categorization:  Primary     Baby Feeding: Bottle and Breast Disposition:home with mother   03/25/2018 Cam HaiSHAW, KIMBERLY, CNM  11:30 AM

## 2018-03-25 NOTE — Discharge Instructions (Signed)

## 2018-03-25 NOTE — Progress Notes (Addendum)
Per day shift Rn the interpreter  form [employer]  will be here in the am. [employer] does not have an interpreter for the language mom speaks. Dad does speak some english.

## 2018-03-28 ENCOUNTER — Ambulatory Visit (HOSPITAL_COMMUNITY): Payer: Medicaid Other

## 2018-03-30 ENCOUNTER — Inpatient Hospital Stay (HOSPITAL_COMMUNITY)
Admission: RE | Admit: 2018-03-30 | Payer: Medicaid Other | Source: Ambulatory Visit | Admitting: Obstetrics & Gynecology

## 2018-03-31 ENCOUNTER — Ambulatory Visit (HOSPITAL_COMMUNITY): Payer: Medicaid Other

## 2018-04-08 ENCOUNTER — Other Ambulatory Visit (HOSPITAL_COMMUNITY): Payer: Self-pay | Admitting: Obstetrics and Gynecology

## 2018-04-08 ENCOUNTER — Ambulatory Visit: Payer: Medicaid Other

## 2018-04-08 DIAGNOSIS — O09529 Supervision of elderly multigravida, unspecified trimester: Secondary | ICD-10-CM

## 2018-04-08 DIAGNOSIS — Z3A37 37 weeks gestation of pregnancy: Secondary | ICD-10-CM

## 2018-07-24 IMAGING — US US MFM FETAL BPP W/O NON-STRESS
1 series · 14 of 26 positions shown · non-contrast
Comparison: none

[Series 1: us mfm fetal bpp w/o non-stress · 26 acquisitions, 14 frames shown]
[im 1/26]
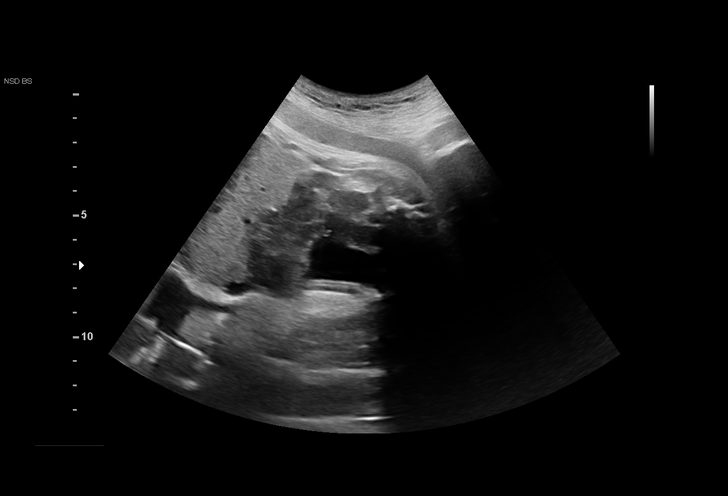
[im 3/26]
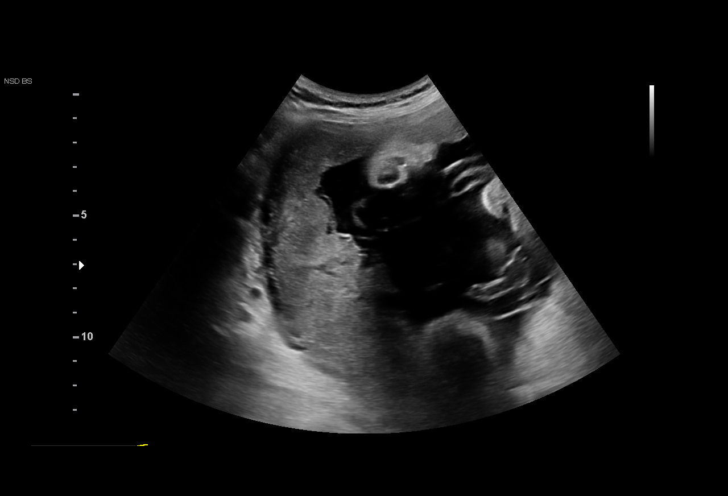
[im 5/26]
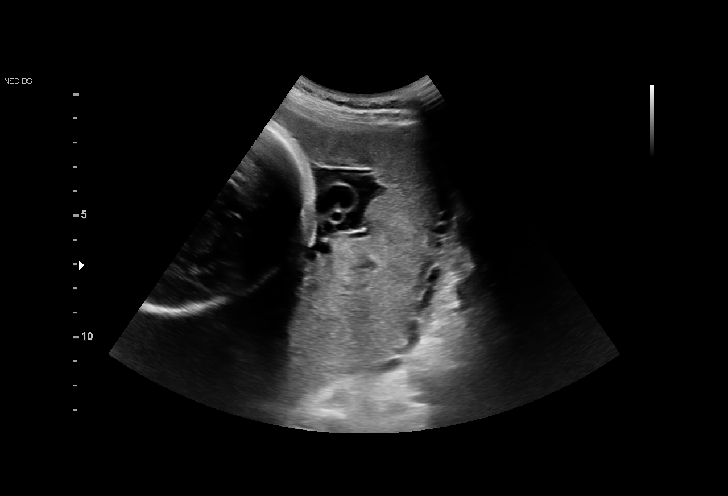
[im 7/26]
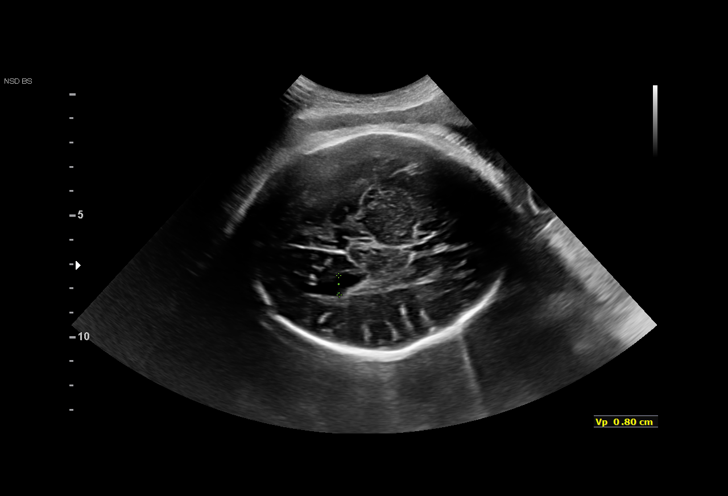
[im 9/26]
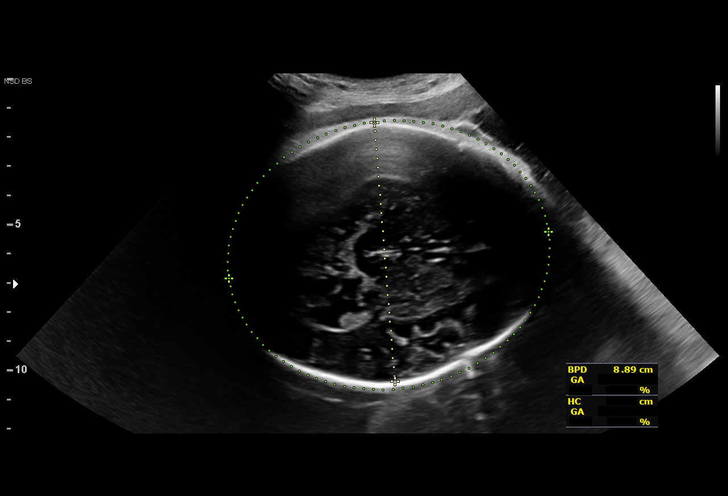
[im 11/26]
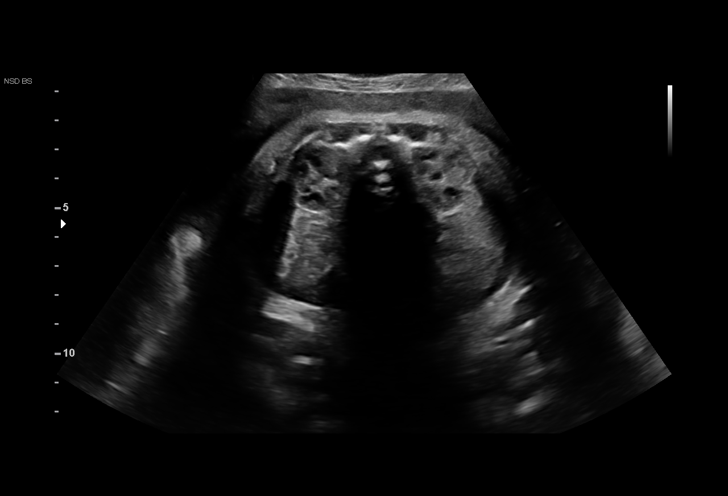
[im 13/26]
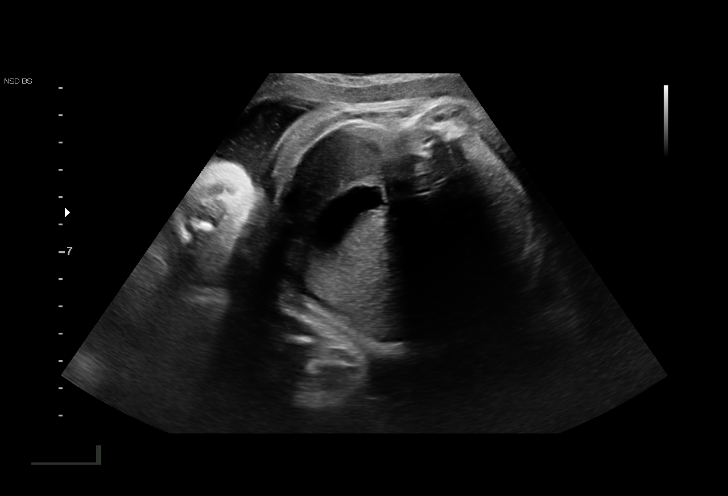
[im 14/26]
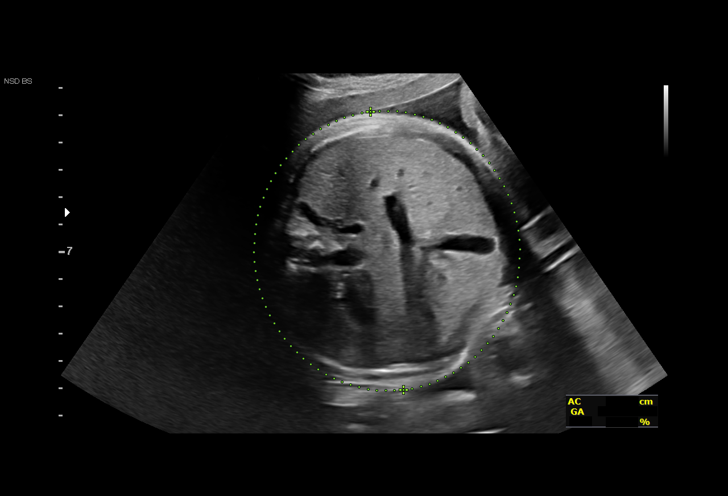
[im 16/26]
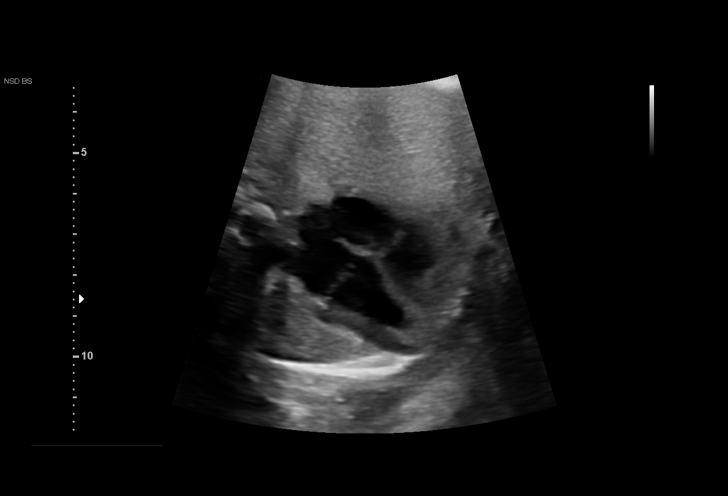
[im 18/26]
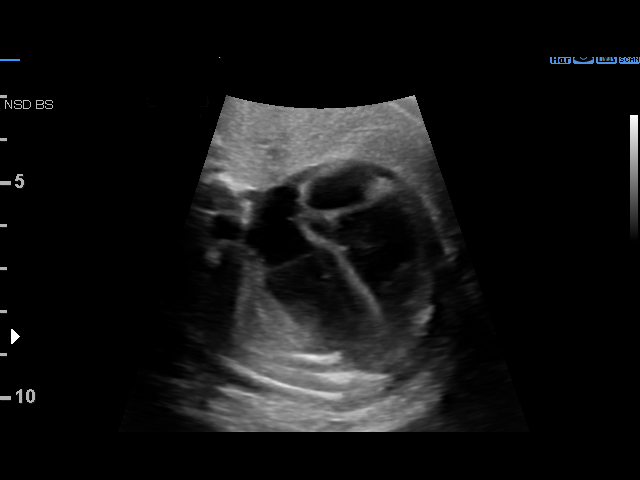
[im 20/26]
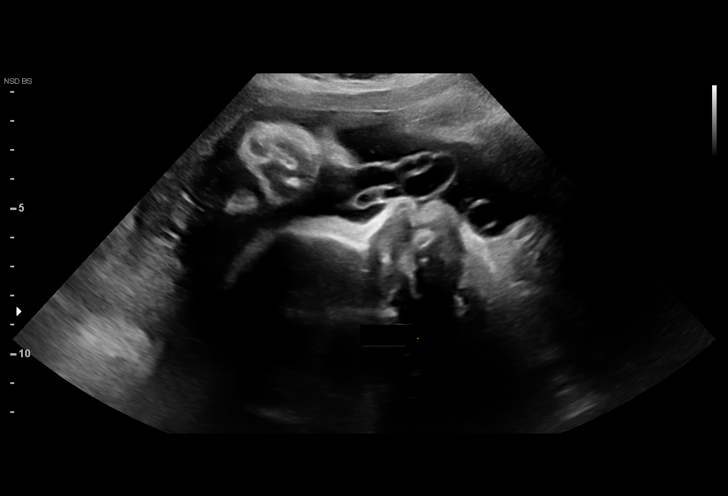
[im 22/26]
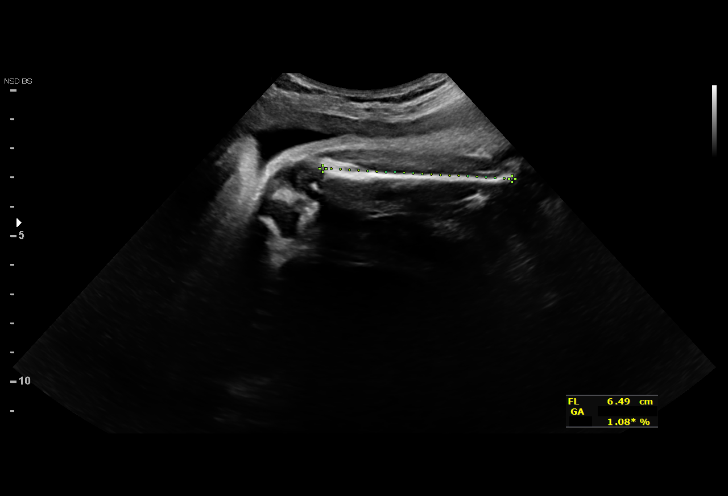
[im 24/26]
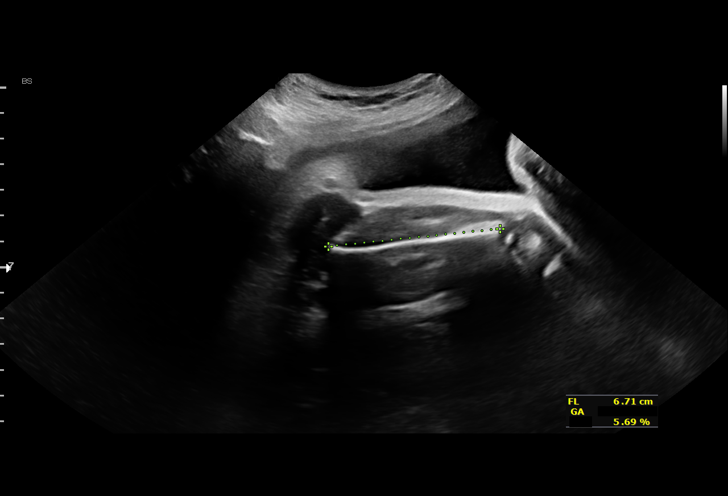
[im 26/26]
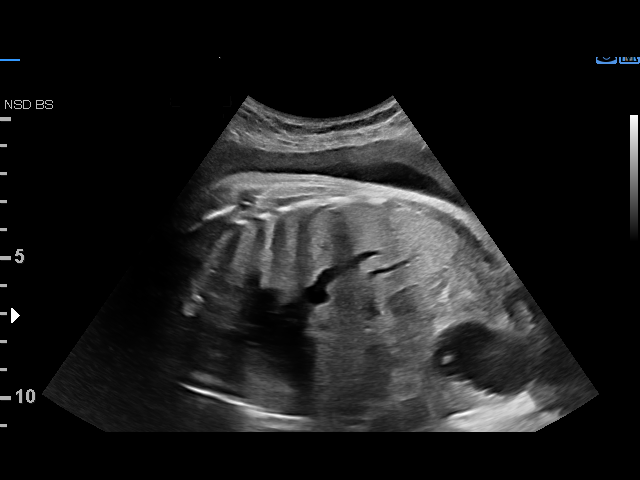

[14 of 26 positions shown; findings below may reference images not displayed]

Indications

36 weeks gestation of pregnancy
Advanced maternal age multigravida 35+,
second trimester
Encounter for other antenatal screening
follow-up
OB History

Blood Type:            Height:  4'10"  Weight (lb):  112       BMI:
Gravidity:    4         Term:   3        Prem:   0        SAB:   0
TOP:          0       Ectopic:  0        Living: 3
Fetal Evaluation

Num Of Fetuses:     1
Fetal Heart         129
Rate(bpm):
Cardiac Activity:   Observed
Presentation:       Frank breech
Placenta:           Posterior, above cervical os
P. Cord Insertion:  Previously Visualized

Amniotic Fluid
AFI FV:      Subjectively within normal limits

AFI Sum(cm)     %Tile       Largest Pocket(cm)
14.33           53

RUQ(cm)       RLQ(cm)       LUQ(cm)        LLQ(cm)
6.39
Biophysical Evaluation

Amniotic F.V:   Within normal limits       F. Tone:        Observed
F. Movement:    Observed                   Score:          [DATE]
F. Breathing:   Observed
Biometry

BPD:      88.8  mm     G. Age:  35w 6d         40  %    CI:        77.16   %    70 - 86
FL/HC:      21.0   %    20.8 -
HC:      320.1  mm     G. Age:  36w 1d         13  %    HC/AC:      1.01        0.92 -
AC:      315.4  mm     G. Age:  35w 3d         28  %    FL/BPD:     75.7   %    71 - 87
FL:       67.2  mm     G. Age:  34w 4d          7  %    FL/AC:      21.3   %    20 - 24

Est. FW:    9571  gm    5 lb 14 oz      35  %
Gestational Age

LMP:           32w 3d        Date:  07/30/17                 EDD:   05/06/18
U/S Today:     35w 4d                                        EDD:   04/14/18
Best:          36w 5d     Det. By:  U/S  (01/03/18)          EDD:   04/06/18
Anatomy

Cranium:               Appears normal         Aortic Arch:            Previously seen
Cavum:                 Previously seen        Ductal Arch:            Previously seen
Ventricles:            Previously seen        Diaphragm:              Previously seen
Choroid Plexus:        Previously seen        Stomach:                Appears normal, left
sided
Cerebellum:            Previously seen        Abdomen:                Appears normal
Posterior Fossa:       Previously seen        Abdominal Wall:         Previously seen
Nuchal Fold:           Not applicable (>20    Cord Vessels:           Previously seen
wks GA)
Face:                  Orbits and profile     Kidneys:                Appear normal
previously seen
Lips:                  Previously seen        Bladder:                Appears normal
Thoracic:              Appears normal         Spine:                  Previously seen
Heart:                 Appears normal         Upper Extremities:      Previously seen
(4CH, axis, and situs
RVOT:                  Previously seen        Lower Extremities:      Previously seen
LVOT:                  Previously seen

Other:  Female gender previously seen. Heels previously seen.
Cervix Uterus Adnexa

Cervix
Not visualized (advanced GA >36wks)

Left Ovary
Previously seen.

Right Ovary
Previously seen

Adnexa:       No abnormality visualized.
Impression

Single living intrauterine pregnancy at 36w 5d.
Frank breech presentation.
Placenta Posterior, above cervical os.
Normal amniotic fluid volume.
Composite fetal growth in the 35%ile.
Normal interval fetal anatomy.
BPP [DATE].
Recommendations

Continue antenatal testing (age > 40).
Recommend delivery by 39 weeks if maternal and fetal status
remain reassuring.

## 2023-06-26 NOTE — Congregational Nurse Program (Signed)
[employer] intern Jake Seats assisted patient with completing Medicaid application.  Brantley Fling RN Congregational Nurse [phone]

## 2023-08-14 NOTE — Congregational Nurse Program (Signed)
CN office visit with husband and interpreter Du Hartshorn assisting.  Patient stated they received phone call from [facility] yesterday but due to language barrier could not understand purpose of call. We attempted to call the number shown on patient's phone but unable to reach anyone so we called local DSS.  Their representative stated the family has been approved for Medicaid and a letter was mailed to them on 08/12/2023.  Brantley Fling RN, Congregational Nurse [phone]

## 2023-09-04 ENCOUNTER — Ambulatory Visit: Payer: Medicaid Other

## 2023-09-04 DIAGNOSIS — Z23 Encounter for immunization: Secondary | ICD-10-CM

## 2023-09-04 NOTE — Congregational Nurse Program (Signed)
CN office visit for flu shot.  Stated she has difficulty hearing in right ear.  Approximately 1 month ago she had pain and discharge from ear but did not seek medical care due to lack of insurance.  Advised patient to go to [facility] since she now has Medicaid and gave her clinic hours and address. Brantley Fling RN, Congregational Nurse [phone]

## 2024-04-29 NOTE — Congregational Nurse Program (Signed)
 CN office visit with interpreter Diu Hartshorn assisting.  Patient had questions about Nexplanon regarding how long it will be effective.  She had a card stating that it needs to be replaced on 10/04/2024.  Elveria Rummer RN, Congregational Nurse [phone]

## 2024-08-26 NOTE — Congregational Nurse Program (Signed)
 Assisted patient and husband with completing a Medicaid application.  Elveria Rummer RN, Congregational Nurse [phone]
